# Patient Record
Sex: Male | Born: 1955 | Race: White | Hispanic: No | Smoking: Current some day smoker
Health system: Southern US, Community
[De-identification: ages and names within clinical notes are randomized; demographics above are authoritative.]

## PROBLEM LIST (undated history)

## (undated) DIAGNOSIS — F329 Major depressive disorder, single episode, unspecified: Secondary | ICD-10-CM

## (undated) DIAGNOSIS — F1910 Other psychoactive substance abuse, uncomplicated: Secondary | ICD-10-CM

## (undated) DIAGNOSIS — F32A Depression, unspecified: Secondary | ICD-10-CM

## (undated) DIAGNOSIS — F419 Anxiety disorder, unspecified: Secondary | ICD-10-CM

## (undated) DIAGNOSIS — L409 Psoriasis, unspecified: Secondary | ICD-10-CM

## (undated) HISTORY — PX: HERNIA REPAIR: SHX51

## (undated) HISTORY — DX: Other psychoactive substance abuse, uncomplicated: F19.10

## (undated) HISTORY — DX: Psoriasis, unspecified: L40.9

---

## 2001-02-10 ENCOUNTER — Ambulatory Visit (HOSPITAL_COMMUNITY): Admission: RE | Admit: 2001-02-10 | Discharge: 2001-02-10 | Payer: Self-pay | Admitting: Internal Medicine

## 2001-02-10 ENCOUNTER — Encounter: Payer: Self-pay | Admitting: Internal Medicine

## 2001-02-16 ENCOUNTER — Encounter: Payer: Self-pay | Admitting: Internal Medicine

## 2001-02-16 ENCOUNTER — Ambulatory Visit (HOSPITAL_COMMUNITY): Admission: RE | Admit: 2001-02-16 | Discharge: 2001-02-16 | Payer: Self-pay | Admitting: Internal Medicine

## 2009-09-21 ENCOUNTER — Emergency Department (HOSPITAL_COMMUNITY): Admission: EM | Admit: 2009-09-21 | Discharge: 2009-09-21 | Payer: Self-pay | Admitting: Emergency Medicine

## 2009-09-23 ENCOUNTER — Emergency Department (HOSPITAL_COMMUNITY): Admission: EM | Admit: 2009-09-23 | Discharge: 2009-09-23 | Payer: Self-pay | Admitting: Emergency Medicine

## 2010-07-07 LAB — BASIC METABOLIC PANEL
BUN: 8 mg/dL (ref 6–23)
CO2: 23 mEq/L (ref 19–32)
Calcium: 9.1 mg/dL (ref 8.4–10.5)
Chloride: 106 mEq/L (ref 96–112)
Creatinine, Ser: 0.8 mg/dL (ref 0.4–1.5)
GFR calc Af Amer: >60 mL/min (ref >60)
GFR calc non Af Amer: >60 mL/min (ref >60)
Glucose, Bld: 96 mg/dL (ref 70–99)
Potassium: 4.3 mEq/L (ref 3.5–5.1)
Sodium: 137 mEq/L (ref 135–145)

## 2010-07-07 LAB — DIFFERENTIAL
Basophils Absolute: 0.1 10*3/uL (ref 0.0–0.1)
Basophils Relative: 1 % (ref 0–1)
Eosinophils Absolute: 0.4 10*3/uL (ref 0.0–0.7)
Eosinophils Relative: 6 % — ABNORMAL HIGH (ref 0–5)
Lymphocytes Relative: 17 % (ref 12–46)
Lymphs Abs: 1.1 10*3/uL (ref 0.7–4.0)
Monocytes Absolute: 0.8 10*3/uL (ref 0.1–1.0)
Monocytes Relative: 11 % (ref 3–12)
Neutro Abs: 4.4 10*3/uL (ref 1.7–7.7)
Neutrophils Relative %: 65 % (ref 43–77)

## 2010-07-07 LAB — CBC
HCT: 49.7 % (ref 39.0–52.0)
Hemoglobin: 17 g/dL (ref 13.0–17.0)
MCHC: 34.2 g/dL (ref 30.0–36.0)
MCV: 95 fL (ref 78.0–100.0)
Platelets: 300 10*3/uL (ref 150–400)
RBC: 5.23 MIL/uL (ref 4.22–5.81)
RDW: 13.9 % (ref 11.5–15.5)
WBC: 6.7 10*3/uL (ref 4.0–10.5)

## 2011-04-01 ENCOUNTER — Encounter: Payer: Self-pay | Admitting: *Deleted

## 2011-04-01 ENCOUNTER — Encounter: Payer: Self-pay | Admitting: Cardiology

## 2011-04-02 ENCOUNTER — Ambulatory Visit: Payer: Self-pay | Admitting: Cardiology

## 2011-04-15 ENCOUNTER — Encounter: Payer: Self-pay | Admitting: Cardiology

## 2011-05-20 ENCOUNTER — Emergency Department (HOSPITAL_COMMUNITY)
Admission: EM | Admit: 2011-05-20 | Discharge: 2011-05-20 | Disposition: A | Discharge status: Home / Self Care | Payer: Self-pay | Attending: Emergency Medicine | Admitting: Emergency Medicine

## 2011-05-20 ENCOUNTER — Encounter (HOSPITAL_COMMUNITY): Payer: Self-pay | Admitting: Emergency Medicine

## 2011-05-20 ENCOUNTER — Other Ambulatory Visit: Payer: Self-pay

## 2011-05-20 ENCOUNTER — Emergency Department (HOSPITAL_COMMUNITY): Payer: Self-pay

## 2011-05-20 DIAGNOSIS — R071 Chest pain on breathing: Secondary | ICD-10-CM | POA: Insufficient documentation

## 2011-05-20 DIAGNOSIS — F17200 Nicotine dependence, unspecified, uncomplicated: Secondary | ICD-10-CM | POA: Insufficient documentation

## 2011-05-20 DIAGNOSIS — R0789 Other chest pain: Secondary | ICD-10-CM

## 2011-05-20 DIAGNOSIS — J40 Bronchitis, not specified as acute or chronic: Secondary | ICD-10-CM | POA: Insufficient documentation

## 2011-05-20 DIAGNOSIS — I10 Essential (primary) hypertension: Secondary | ICD-10-CM | POA: Insufficient documentation

## 2011-05-20 LAB — BASIC METABOLIC PANEL
CO2: 25 mEq/L (ref 19–32)
Calcium: 9.9 mg/dL (ref 8.4–10.5)
Chloride: 100 mEq/L (ref 96–112)
Sodium: 135 mEq/L (ref 135–145)

## 2011-05-20 LAB — CBC
Platelets: 308 10*3/uL (ref 150–400)
RBC: 5.01 MIL/uL (ref 4.22–5.81)
WBC: 7.7 10*3/uL (ref 4.0–10.5)

## 2011-05-20 LAB — TROPONIN I: Troponin I: <0.3 ng/mL (ref <0.30)

## 2011-05-20 MED ORDER — DOXYCYCLINE HYCLATE 100 MG PO CAPS: 100.0000 mg | ORAL_CAPSULE | Freq: Two times a day (BID) | ORAL | Status: AC

## 2011-05-20 NOTE — ED Notes (Signed)
Pt c/o intermittent cp x 6 months-worsening and sharp this am.

## 2011-05-20 NOTE — ED Provider Notes (Signed)
History   This chart was scribed for Ward Givens, MD by Clarita Crane. The patient was seen in room APA10/[facility]10 and the patient's care was started at 10:04AM.   CSN: [id number]  Arrival date & time 05/20/11  [id number]   First MD Initiated Contact with Patient 05/20/11 [phone]      Chief Complaint  Patient presents with  . Chest Pain    (Consider location/radiation/quality/duration/timing/severity/associated sxs/prior treatment) HPI Justin Washington is a 56 y.o. male who presents to the Emergency Department complaining of intermittent moderate sharp non-radiating chest pain localized to sternal region onset several months ago but more frequent the past 3 days with associated productive cough white and yellow in color. States he gets the pain about every week but has had it more frequently the past week.  States chest pain will last several seconds and then relieve. Notes chest pain is not relieved or aggravated by anything. Patient also reports that he has had his blood pressure measured several times over the past several weeks by a [facility] nurse who comes to the shelter and states it has been elevated ( 144/95 and 135/95). Denies SOB, fever, diaphoresis, fever. Patient is a current smoker and is an occasional drinker (3x per week, approximately 40oz beer). Relates he was a heavier drinker but his current shelter does breathalizer daily. Relates he started cutting back in December and he drinks about every 3 days because he feels like he is withdrawing. Denies family history of cardiac problems.   PCP-None  Past Medical History  Diagnosis Date  . Chest pain   . Hypertension     Past Surgical History  Procedure Date  . Hernia repair     Family History  Problem Relation Age of Onset  . Cancer Mother     History  Substance Use Topics  . Smoking status: Current Everyday Smoker  . Smokeless tobacco: Not on file  . 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  Allergies  Hydrocodone  Home Medications  No current outpatient prescriptions on file.     BP 156/105  Pulse 79  Temp 98.2 F (36.8 C)  Resp 18  SpO2 95%  Vital signs normal    Physical Exam  Nursing note and vitals reviewed. Constitutional: He is oriented to person, place, and time. He appears well-developed and well-nourished. No distress.  HENT:  Head: Normocephalic and atraumatic.  Mouth/Throat: Oropharynx is clear and moist. No oropharyngeal exudate.  Eyes: EOM are normal. Pupils are equal, round, and reactive to light.  Neck: Normal range of motion. Neck supple. No tracheal deviation present.  Cardiovascular: Normal rate and regular rhythm.   No murmur heard. Pulmonary/Chest: Effort normal. No respiratory distress. He has no wheezes. He has no rales. He exhibits tenderness (well localized to lower left costochondral junction and approximatel size of a dime).         The tenderness reproduces his pain.  Abdominal: Soft. He exhibits no distension. There is no tenderness.       Patient has a gap between the linea alba of his upper abdomen above the umbilicus. There is no palpable hernia at this time when examined laying down  Musculoskeletal: Normal range of motion. He exhibits no edema.  Neurological: He is alert and oriented to person, place,  and time. No sensory deficit.  Skin: Skin is warm and dry.  Psychiatric: He has a normal mood and affect. His speech is normal and behavior is normal.    ED Course  Procedures (including critical care time)  DIAGNOSTIC STUDIES: Oxygen Saturation is 95% on room air, adequate by my interpretation.    COORDINATION OF CARE: 10:13AM- Patient informed of lab and imaging results and intent to d/c home. Patient agrees with plan set forth at this time.     Results for orders placed during the  hospital encounter of 05/20/11  CBC      Component Value Range   WBC 7.7  4.0 - 10.5 (K/uL)   RBC 5.01  4.22 - 5.81 (MIL/uL)   Hemoglobin 16.0  13.0 - 17.0 (g/dL)   HCT 40.9  81.1 - 91.4 (%)   MCV 92.8  78.0 - 100.0 (fL)   MCH 31.9  26.0 - 34.0 (pg)   MCHC 34.4  30.0 - 36.0 (g/dL)   RDW 78.2  95.6 - 21.3 (%)   Platelets 308  150 - 400 (K/uL)  BASIC METABOLIC PANEL      Component Value Range   Sodium 135  135 - 145 (mEq/L)   Potassium 4.1  3.5 - 5.1 (mEq/L)   Chloride 100  96 - 112 (mEq/L)   CO2 25  19 - 32 (mEq/L)   Glucose, Bld 98  70 - 99 (mg/dL)   BUN 13  6 - 23 (mg/dL)   Creatinine, Ser 0.86  0.50 - 1.35 (mg/dL)   Calcium 9.9  8.4 - 57.8 (mg/dL)   GFR calc non Af Amer >90  >90 (mL/min)   GFR calc Af Amer >90  >90 (mL/min)  TROPONIN I      Component Value Range   Troponin I <0.30  <0.30 (ng/mL)   Laboratory interpretation all normal except    Chest Portable 1 View  05/20/2011  *RADIOLOGY REPORT*  Clinical Data: Chest pain, smoker.  PORTABLE CHEST - 1 VIEW  Comparison: None  Findings: Heart and mediastinal contours are within normal limits. No focal opacities or effusions.  No acute bony abnormality.  IMPRESSION: No active cardiopulmonary disease.  Original Report Authenticated By: Cyndie Chime, M.D.    Date: 05/20/2011  Rate: 80  Rhythm: normal sinus rhythm  QRS Axis: normal  Intervals: normal  ST/T Wave abnormalities: normal  Conduction Disutrbances:none  Narrative Interpretation:   Old EKG Reviewed: none available   Diagnoses that have been ruled out:  None  Diagnoses that are still under consideration:  None  Final diagnoses:  Chest wall pain  Bronchitis   New Prescriptions   DOXYCYCLINE (VIBRAMYCIN) 100 MG CAPSULE    Take 1 capsule (100 mg total) by mouth 2 (two) times daily.    Plan discharge   Devoria Albe, MD, FACEP    MDM     I personally performed the services described in this documentation, which was scribed in my presence. The recorded  information has been reviewed and considered. Devoria Albe, MD, Armando Gang      Ward Givens, MD 05/20/11 [phone]

## 2011-05-29 ENCOUNTER — Encounter: Payer: Self-pay | Admitting: Cardiology

## 2011-06-01 NOTE — Progress Notes (Signed)
This encounter was created in error - please disregard.

## 2011-06-30 ENCOUNTER — Encounter: Payer: Self-pay | Admitting: Cardiology

## 2011-10-28 IMAGING — CR DG FOOT COMPLETE 3+V*L*
3 series · 3 of 3 positions shown · non-contrast
Comparison: None.

CLINICAL DATA: Erythema and pain with swelling over the dorsum of
the left foot in the region of the second and third toes

LEFT FOOT - COMPLETE 3+ VIEW

[view not recorded (1 of 3)]
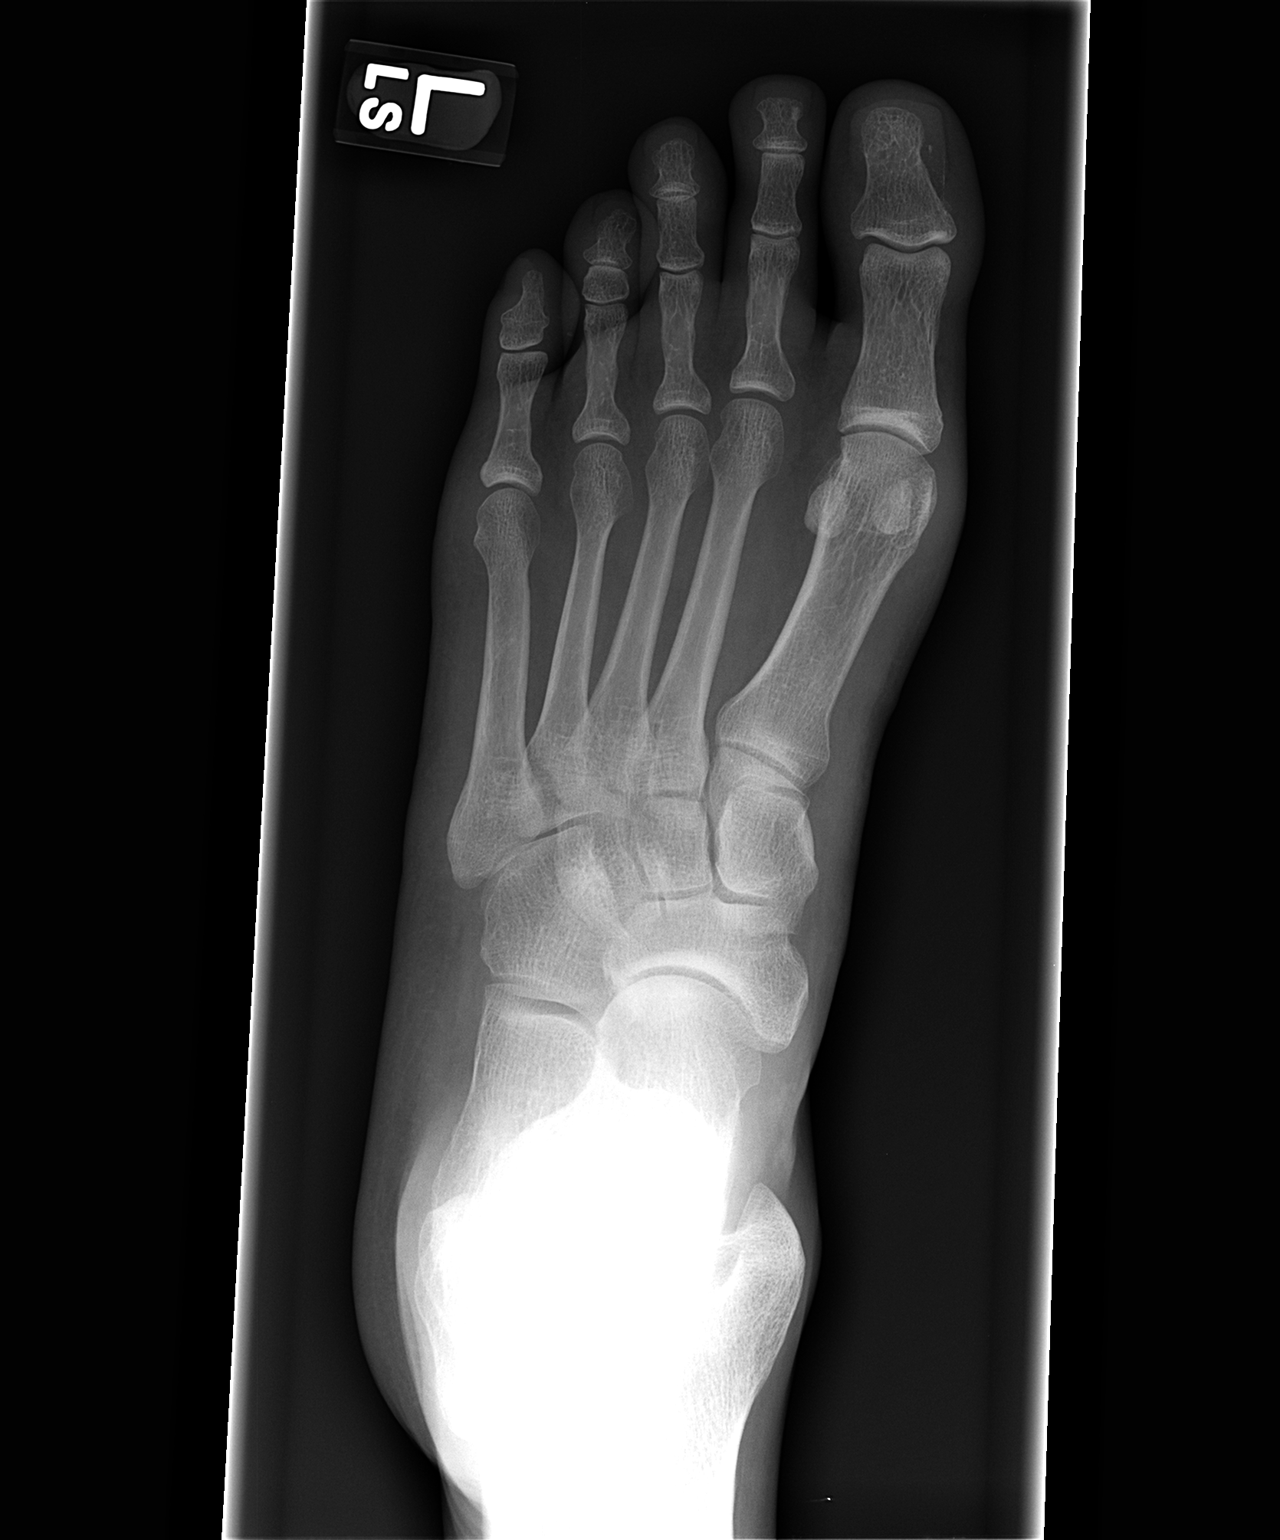

[view not recorded (2 of 3)]
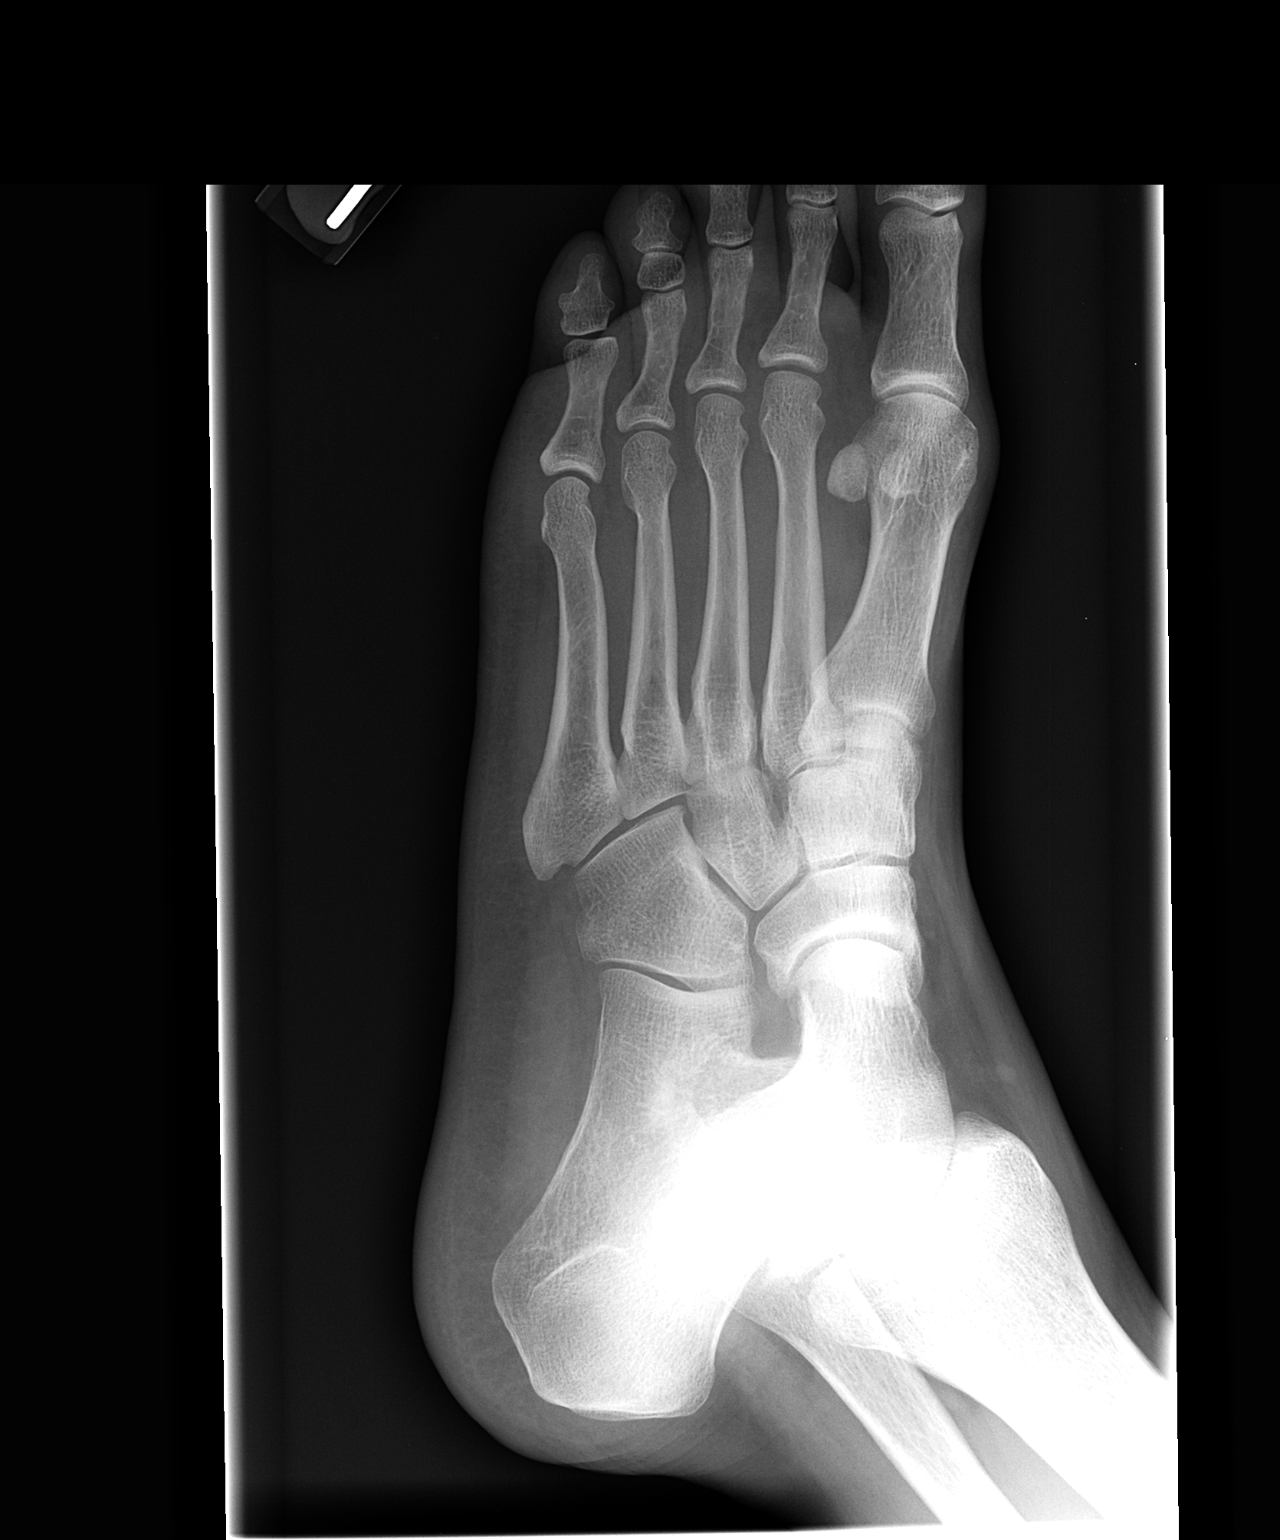

[view not recorded (3 of 3)]
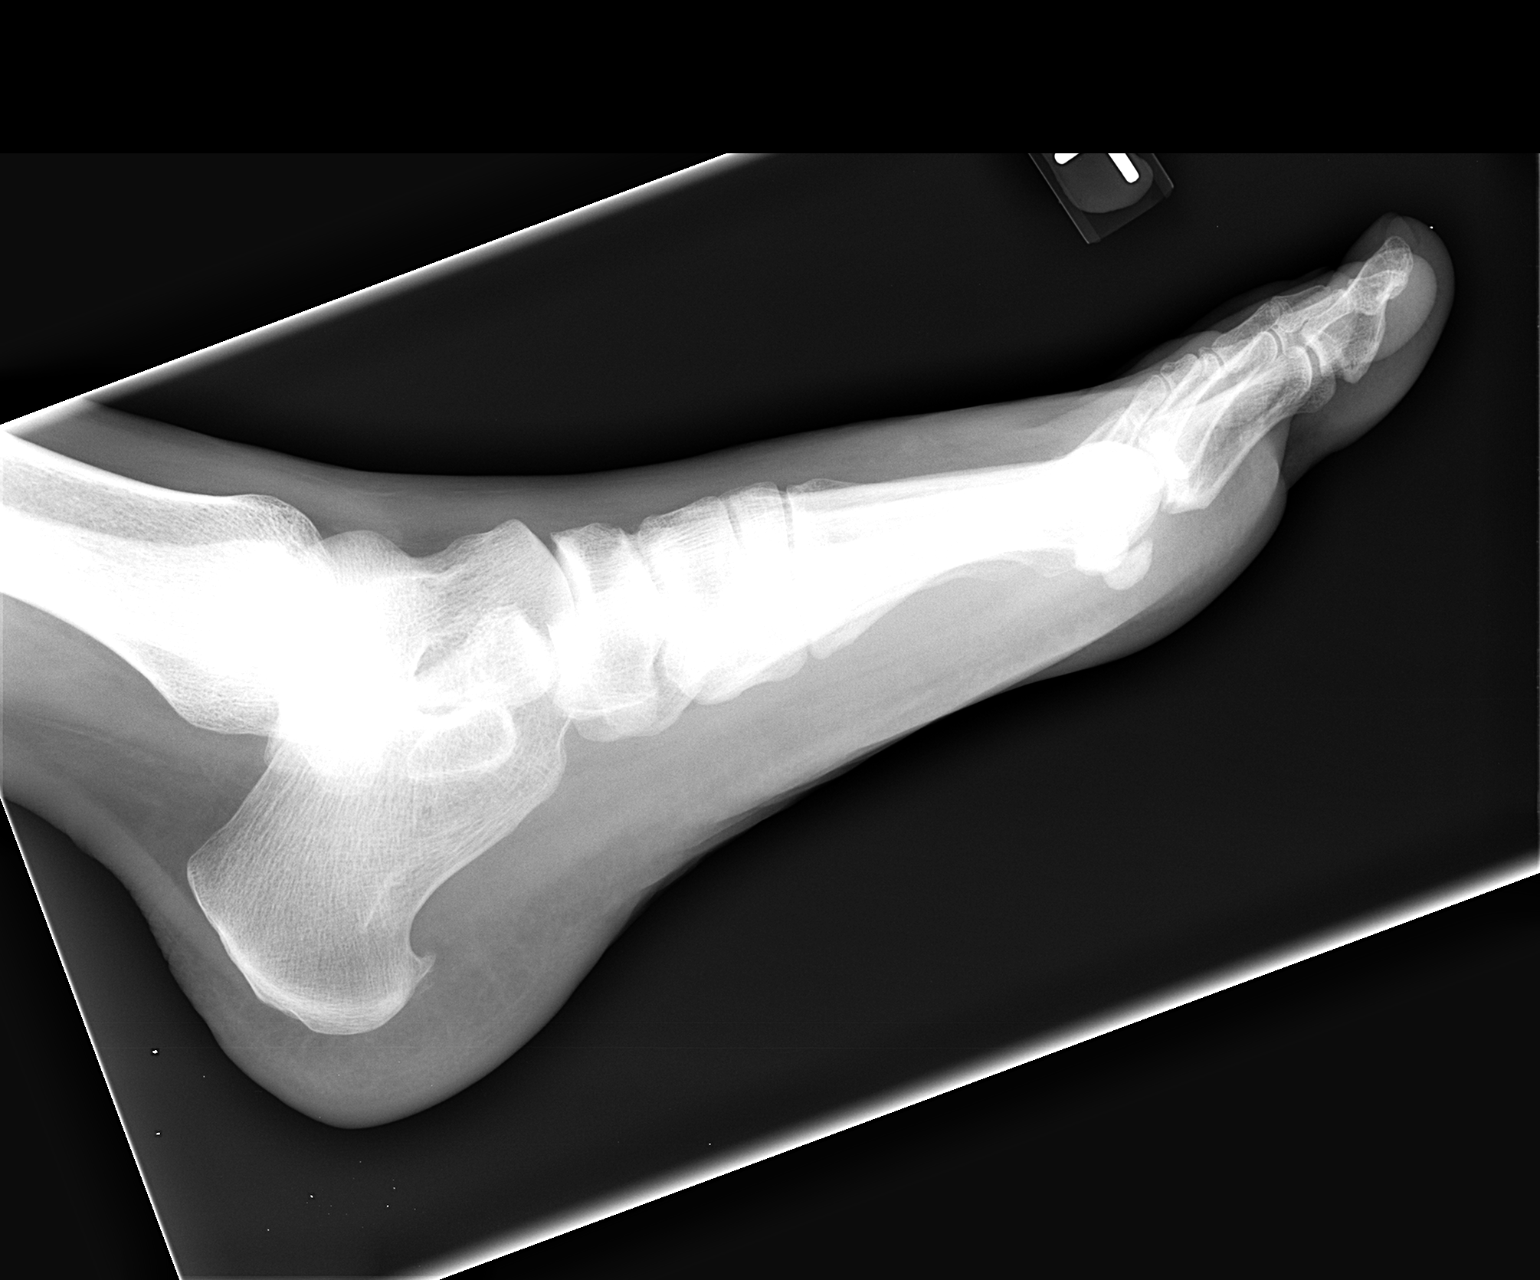

[3 of 3 positions shown; findings below may reference images not displayed]

FINDINGS: No fracture or dislocation.  No soft tissue abnormality.
No radiopaque foreign body. Plantar calcaneal spurring noted.
IMPRESSION: No acute bony abnormality.

## 2013-05-08 ENCOUNTER — Other Ambulatory Visit: Payer: Self-pay | Admitting: Nurse Practitioner

## 2013-09-06 ENCOUNTER — Other Ambulatory Visit: Payer: Self-pay | Admitting: Nurse Practitioner

## 2016-02-04 ENCOUNTER — Ambulatory Visit: Payer: Self-pay | Admitting: Physician Assistant

## 2016-02-04 ENCOUNTER — Other Ambulatory Visit: Payer: Self-pay | Admitting: Physician Assistant

## 2016-02-04 ENCOUNTER — Encounter: Payer: Self-pay | Admitting: Physician Assistant

## 2016-02-04 VITALS — BP 130/84 | HR 80 | Temp 98.1°F | Ht 67.5 in | Wt 187.5 lb

## 2016-02-04 DIAGNOSIS — Z131 Encounter for screening for diabetes mellitus: Secondary | ICD-10-CM

## 2016-02-04 DIAGNOSIS — Z125 Encounter for screening for malignant neoplasm of prostate: Secondary | ICD-10-CM

## 2016-02-04 DIAGNOSIS — Z8639 Personal history of other endocrine, nutritional and metabolic disease: Secondary | ICD-10-CM

## 2016-02-04 DIAGNOSIS — Z1211 Encounter for screening for malignant neoplasm of colon: Secondary | ICD-10-CM

## 2016-02-04 DIAGNOSIS — L409 Psoriasis, unspecified: Secondary | ICD-10-CM

## 2016-02-04 LAB — GLUCOSE, POCT (MANUAL RESULT ENTRY): POC GLUCOSE: 87 mg/dL (ref 70–99)

## 2016-02-04 MED ORDER — TRIAMCINOLONE ACETONIDE 0.1 % EX OINT: 1.0000 "application " | TOPICAL_OINTMENT | Freq: Two times a day (BID) | CUTANEOUS | 0 refills | Status: DC | PRN

## 2016-02-04 NOTE — Progress Notes (Signed)
BP 130/84 (BP Location: Left Arm, Patient Position: Sitting, Cuff Size: Normal)   Pulse 80   Temp 98.1 F (36.7 C)   Ht 5' 7.5" (1.715 m)   Wt 187 lb 8 oz (85 kg)   SpO2 96%   BMI 28.93 kg/m    Subjective:    Patient ID: Justin Washington, male    DOB: Oct 03, 1955, 60 y.o.   MRN: [id number]  HPI: Justin Washington is a 60 y.o. male presenting on 02/04/2016 for New Patient (Initial Visit) (pt states he previously came to this practice about 5 years ago) and Hypertension (pt states BP yesterday when he went to the dentist was between 150/100)   HPI  Chief Complaint  Patient presents with  . New Patient (Initial Visit)    pt states he previously came to this practice about 5 years ago  . Hypertension    pt states BP yesterday when he went to the dentist was between 150/100    Pt was seen here previously.  Last OV 10/24/12.  Pt comes in now because he wants some dental work done and the dentist says his blood pressure has been elevated.  Pt has no history of hypertension.  Pt has remote history substance abuse but is staying sober and is doing well.  Relevant past medical, surgical, family and social history reviewed and updated as indicated. Interim medical history since our last visit reviewed. Allergies and medications reviewed and updated.  CURRENT MEDS: none  Review of Systems  Constitutional: Negative for appetite change, chills, diaphoresis, fatigue, fever and unexpected weight change.  HENT: Negative for congestion, dental problem, drooling, ear pain, facial swelling, hearing loss, mouth sores, sneezing, sore throat, trouble swallowing and voice change.   Eyes: Negative for pain, discharge, redness, itching and visual disturbance.  Respiratory: Negative for cough, choking, shortness of breath and wheezing.   Cardiovascular: Negative for chest pain, palpitations and leg swelling.  Gastrointestinal: Negative for abdominal pain, blood in stool, constipation, diarrhea and  vomiting.  Endocrine: Negative for cold intolerance, heat intolerance and polydipsia.  Genitourinary: Negative for decreased urine volume, dysuria and hematuria.  Musculoskeletal: Negative for arthralgias, back pain and gait problem.  Skin: Negative for rash.  Allergic/Immunologic: Negative for environmental allergies.  Neurological: Negative for seizures, syncope, light-headedness and headaches.  Hematological: Negative for adenopathy.  Psychiatric/Behavioral: Negative for agitation, dysphoric mood and suicidal ideas. The patient is not nervous/anxious.     Per HPI unless specifically indicated above     Objective:    BP 130/84 (BP Location: Left Arm, Patient Position: Sitting, Cuff Size: Normal)   Pulse 80   Temp 98.1 F (36.7 C)   Ht 5' 7.5" (1.715 m)   Wt 187 lb 8 oz (85 kg)   SpO2 96%   BMI 28.93 kg/m   Wt Readings from Last 3 Encounters:  02/04/16 187 lb 8 oz (85 kg)    Physical Exam  Constitutional: He is oriented to person, place, and time. He appears well-developed and well-nourished.  HENT:  Head: Normocephalic and atraumatic.  Mouth/Throat: Oropharynx is clear and moist. No oropharyngeal exudate.  Eyes: Conjunctivae and EOM are normal. Pupils are equal, round, and reactive to light.  Neck: Neck supple. No thyromegaly present.  Cardiovascular: Normal rate and regular rhythm.   Pulmonary/Chest: Effort normal and breath sounds normal. He has no wheezes. He has no rales.  Abdominal: Soft. Bowel sounds are normal. He exhibits no mass. There is no hepatosplenomegaly. There is no tenderness.  Musculoskeletal: He exhibits no edema.  Lymphadenopathy:    He has no cervical adenopathy.  Neurological: He is alert and oriented to person, place, and time.  Skin: Skin is warm and dry. No rash noted.  Plaques bilateral elbows  Psychiatric: He has a normal mood and affect. His behavior is normal. Thought content normal.  Vitals reviewed.     Results for orders placed or  performed in visit on 02/04/16  POCT Glucose (CBG)  Result Value Ref Range   POC Glucose 87 70 - 99 mg/dl      Assessment & Plan:   Encounter Diagnoses  Name Primary?  . Psoriasis Yes  . History of hyperlipidemia   . Screening for prostate cancer   . Screening for diabetes mellitus (DM)   . Special screening for malignant neoplasms, colon     -ifobt was given for colon cancer screening -blood pressure good today.   -will fill out form and fax to dentist -rx TAC ointment given for elbows -f/u one month to review labs and recheck bp. RTO sooner prn

## 2016-02-05 LAB — COMPREHENSIVE METABOLIC PANEL
ALBUMIN: 4.3 g/dL (ref 3.6–5.1)
ALT: 16 U/L (ref 9–46)
AST: 16 U/L (ref 10–35)
Alkaline Phosphatase: 43 U/L (ref 40–115)
BUN: 18 mg/dL (ref 7–25)
CALCIUM: 10.1 mg/dL (ref 8.6–10.3)
CO2: 25 mmol/L (ref 20–31)
Chloride: 104 mmol/L (ref 98–110)
Creat: 1 mg/dL (ref 0.70–1.25)
GLUCOSE: 93 mg/dL (ref 65–99)
POTASSIUM: 5.5 mmol/L — AB (ref 3.5–5.3)
Sodium: 140 mmol/L (ref 135–146)
Total Bilirubin: 0.5 mg/dL (ref 0.2–1.2)
Total Protein: 7 g/dL (ref 6.1–8.1)

## 2016-02-05 LAB — LIPID PANEL
CHOL/HDL RATIO: 4.2 ratio (ref <=5.0)
CHOLESTEROL: 194 mg/dL (ref 125–200)
HDL: 46 mg/dL (ref >=40)
LDL Cholesterol: 131 mg/dL — ABNORMAL HIGH (ref <130)
TRIGLYCERIDES: 83 mg/dL (ref <150)
VLDL: 17 mg/dL (ref <30)

## 2016-02-06 LAB — IFOBT (OCCULT BLOOD): IMMUNOLOGICAL FECAL OCCULT BLOOD TEST: NEGATIVE

## 2016-02-06 LAB — PSA: PSA: 1.5 ng/mL (ref <=4.0)

## 2016-03-04 ENCOUNTER — Encounter: Payer: Self-pay | Admitting: Physician Assistant

## 2016-03-04 ENCOUNTER — Ambulatory Visit: Payer: Self-pay | Admitting: Physician Assistant

## 2016-03-04 VITALS — BP 124/72 | HR 80 | Temp 97.9°F | Ht 67.5 in | Wt 189.5 lb

## 2016-03-04 DIAGNOSIS — E785 Hyperlipidemia, unspecified: Secondary | ICD-10-CM

## 2016-03-04 DIAGNOSIS — L409 Psoriasis, unspecified: Secondary | ICD-10-CM

## 2016-03-04 DIAGNOSIS — E875 Hyperkalemia: Secondary | ICD-10-CM

## 2016-03-04 NOTE — Progress Notes (Signed)
BP 124/72 (BP Location: Left Arm, Patient Position: Sitting, Cuff Size: Normal)   Pulse 80   Temp 97.9 F (36.6 C)   Ht 5' 7.5" (1.715 m)   Wt 189 lb 8 oz (86 kg)   SpO2 93%   BMI 29.24 kg/m    Subjective:    Patient ID: Justin Washington, male    DOB: Jan 15, 1956, 60 y.o.   MRN: [id number]  HPI: Justin Washington is a 60 y.o. male presenting on 03/04/2016 for Follow-up and Eczema   HPI   Pt has been using the TAC ointment on his psoriasis areas but says he is not getting any resolution to it.  Says it doesn't seem to do much more than regular lotion.  Relevant past medical, surgical, family and social history reviewed and updated as indicated. Interim medical history since our last visit reviewed. Allergies and medications reviewed and updated.   Current Outpatient Prescriptions:  .  triamcinolone ointment (KENALOG) 0.1 %, Apply 1 application topically 2 (two) times daily as needed., Disp: 80 g, Rfl: 0   Review of Systems  Constitutional: Negative for appetite change, chills, diaphoresis, fatigue, fever and unexpected weight change.  HENT: Negative for congestion, dental problem, drooling, ear pain, facial swelling, hearing loss, mouth sores, sneezing, sore throat, trouble swallowing and voice change.   Eyes: Negative for pain, discharge, redness, itching and visual disturbance.  Respiratory: Negative for cough, choking, shortness of breath and wheezing.   Cardiovascular: Negative for chest pain, palpitations and leg swelling.  Gastrointestinal: Negative for abdominal pain, blood in stool, constipation, diarrhea and vomiting.  Endocrine: Negative for cold intolerance, heat intolerance and polydipsia.  Genitourinary: Negative for decreased urine volume, dysuria and hematuria.  Musculoskeletal: Negative for arthralgias, back pain and gait problem.  Skin: Negative for rash.  Allergic/Immunologic: Negative for environmental allergies.  Neurological: Negative for seizures, syncope,  light-headedness and headaches.  Hematological: Negative for adenopathy.  Psychiatric/Behavioral: Negative for agitation, dysphoric mood and suicidal ideas. The patient is not nervous/anxious.     Per HPI unless specifically indicated above     Objective:    BP 124/72 (BP Location: Left Arm, Patient Position: Sitting, Cuff Size: Normal)   Pulse 80   Temp 97.9 F (36.6 C)   Ht 5' 7.5" (1.715 m)   Wt 189 lb 8 oz (86 kg)   SpO2 93%   BMI 29.24 kg/m   Wt Readings from Last 3 Encounters:  03/04/16 189 lb 8 oz (86 kg)  02/04/16 187 lb 8 oz (85 kg)    Physical Exam  Constitutional: He is oriented to person, place, and time. He appears well-developed and well-nourished.  HENT:  Head: Normocephalic and atraumatic.  Neck: Neck supple.  Cardiovascular: Normal rate and regular rhythm.   Pulmonary/Chest: Effort normal and breath sounds normal. He has no wheezes.  Abdominal: Soft. Bowel sounds are normal. There is no hepatosplenomegaly. There is no tenderness.  Musculoskeletal: He exhibits no edema.  Lymphadenopathy:    He has no cervical adenopathy.  Neurological: He is alert and oriented to person, place, and time.  Skin: Skin is warm and dry. Rash noted.  Scattered plaques- on B elbows and BLE.  Psychiatric: He has a normal mood and affect. His behavior is normal.  Vitals reviewed.   Plaques scattered.    Results for orders placed or performed in visit on 02/04/16  IFOBT POC (occult bld, rslt in office)  Result Value Ref Range   IFOBT Negative  Assessment & Plan:   Encounter Diagnoses  Name Primary?  . Hyperkalemia Yes  . Hyperlipidemia, unspecified hyperlipidemia type   . Psoriasis     -Reviewed las with pt -Recheck K+ -recommended Lowfat diet and exercise . Recheck 3 months -Refer to dermatology for psoriaisis -follow up 3 months.  RTO sooner prn

## 2016-03-04 NOTE — Patient Instructions (Signed)
Fat and Cholesterol Restricted Diet High levels of fat and cholesterol in your blood may lead to various health problems, such as diseases of the heart, blood vessels, gallbladder, liver, and pancreas. Fats are concentrated sources of energy that come in various forms. Certain types of fat, including saturated fat, may be harmful in excess. Cholesterol is a substance needed by your body in small amounts. Your body makes all the cholesterol it needs. Excess cholesterol comes from the food you eat. When you have high levels of cholesterol and saturated fat in your blood, health problems can develop because the excess fat and cholesterol will gather along the walls of your blood vessels, causing them to narrow. Choosing the right foods will help you control your intake of fat and cholesterol. This will help keep the levels of these substances in your blood within normal limits and reduce your risk of disease. What is my plan? Your health care provider recommends that you:  Limit your fat intake to ______% or less of your total calories per day.  Limit the amount of cholesterol in your diet to less than _________mg per day.  Eat 20-30 grams of fiber each day.  What types of fat should I choose?  Choose healthy fats more often. Choose monounsaturated and polyunsaturated fats, such as olive and canola oil, flaxseeds, walnuts, almonds, and seeds.  Eat more omega-3 fats. Good choices include salmon, mackerel, sardines, tuna, flaxseed oil, and ground flaxseeds. Aim to eat fish at least two times a week.  Limit saturated fats. Saturated fats are primarily found in animal products, such as meats, butter, and cream. Plant sources of saturated fats include palm oil, palm kernel oil, and coconut oil.  Avoid foods with partially hydrogenated oils in them. These contain trans fats. Examples of foods that contain trans fats are stick margarine, some tub margarines, cookies, crackers, and other baked goods. What  general guidelines do I need to follow? These guidelines for healthy eating will help you control your intake of fat and cholesterol:  Check food labels carefully to identify foods with trans fats or high amounts of saturated fat.  Fill one half of your plate with vegetables and green salads.  Fill one fourth of your plate with whole grains. Look for the word "whole" as the first word in the ingredient list.  Fill one fourth of your plate with lean protein foods.  Limit fruit to two servings a day. Choose fruit instead of juice.  Eat more foods that contain fiber, such as apples, broccoli, carrots, beans, peas, and barley.  Eat more home-cooked food and less restaurant, buffet, and fast food.  Limit or avoid alcohol.  Limit foods high in starch and sugar.  Limit fried foods.  Cook foods using methods other than frying. Baking, boiling, grilling, and broiling are all great options.  Lose weight if you are overweight. Losing just 5-10% of your initial body weight can help your overall health and prevent diseases such as diabetes and heart disease.  What foods can I eat? Grains  Whole grains, such as whole wheat or whole grain breads, crackers, cereals, and pasta. Unsweetened oatmeal, bulgur, barley, quinoa, or brown rice. Corn or whole wheat flour tortillas. Vegetables  Fresh or frozen vegetables (raw, steamed, roasted, or grilled). Green salads. Fruits  All fresh, canned (in natural juice), or frozen fruits. Meats and other protein foods  Ground beef (85% or leaner), grass-fed beef, or beef trimmed of fat. Skinless chicken or turkey. Ground chicken or turkey.   Pork trimmed of fat. All fish and seafood. Eggs. Dried beans, peas, or lentils. Unsalted nuts or seeds. Unsalted canned or dry beans. Dairy  Low-fat dairy products, such as skim or 1% milk, 2% or reduced-fat cheeses, low-fat ricotta or cottage cheese, or plain low-fat yo Fats and oils  Tub margarines without trans  fats. Light or reduced-fat mayonnaise and salad dressings. Avocado. Olive, canola, sesame, or safflower oils. Natural peanut or almond butter (choose ones without added sugar and oil). The items listed above may not be a complete list of recommended foods or beverages. Contact your dietitian for more options. Foods to avoid Grains  White bread. White pasta. White rice. Cornbread. Bagels, pastries, and croissants. Crackers that contain trans fat. Vegetables  White potatoes. Corn. Creamed or fried vegetables. Vegetables in a cheese sauce. Fruits  Dried fruits. Canned fruit in light or heavy syrup. Fruit juice. Meats and other protein foods  Fatty cuts of meat. Ribs, chicken wings, bacon, sausage, bologna, salami, chitterlings, fatback, hot dogs, bratwurst, and packaged luncheon meats. Liver and organ meats. Dairy  Whole or 2% milk, cream, half-and-half, and cream cheese. Whole milk cheeses. Whole-fat or sweetened yogurt. Full-fat cheeses. Nondairy creamers and whipped toppings. Processed cheese, cheese spreads, or cheese curds. Beverages  Alcohol. Sweetened drinks (such as sodas, lemonade, and fruit drinks or punches). Fats and oils  Butter, stick margarine, lard, shortening, ghee, or bacon fat. Coconut, palm kernel, or palm oils. Sweets and desserts  Corn syrup, sugars, honey, and molasses. Candy. Jam and jelly. Syrup. Sweetened cereals. Cookies, pies, cakes, donuts, muffins, and ice cream. The items listed above may not be a complete list of foods and beverages to avoid. Contact your dietitian for more information. This information is not intended to replace advice given to you by your health care provider. Make sure you discuss any questions you have with your health care provider. Document Released: 04/06/2005 Document Revised: 04/27/2014 Document Reviewed: 07/05/2013 [employer] Interactive Patient Education  2017 [employer]  

## 2016-03-05 LAB — POTASSIUM: POTASSIUM: 4.4 mmol/L (ref 3.5–5.3)

## 2016-05-26 ENCOUNTER — Other Ambulatory Visit: Payer: Self-pay

## 2016-05-26 DIAGNOSIS — E785 Hyperlipidemia, unspecified: Secondary | ICD-10-CM

## 2016-05-29 LAB — LIPID PANEL
CHOLESTEROL: 188 mg/dL (ref <200)
HDL: 47 mg/dL (ref >40)
LDL Cholesterol: 126 mg/dL — ABNORMAL HIGH (ref <100)
TRIGLYCERIDES: 76 mg/dL (ref <150)
Total CHOL/HDL Ratio: 4 Ratio (ref <5.0)
VLDL: 15 mg/dL (ref <30)

## 2016-06-03 ENCOUNTER — Ambulatory Visit: Payer: Self-pay | Admitting: Physician Assistant

## 2016-06-03 ENCOUNTER — Encounter: Payer: Self-pay | Admitting: Physician Assistant

## 2016-06-03 VITALS — BP 126/84 | HR 89 | Temp 97.9°F | Ht 67.5 in | Wt 196.5 lb

## 2016-06-03 DIAGNOSIS — L409 Psoriasis, unspecified: Secondary | ICD-10-CM

## 2016-06-03 DIAGNOSIS — Z125 Encounter for screening for malignant neoplasm of prostate: Secondary | ICD-10-CM

## 2016-06-03 DIAGNOSIS — E785 Hyperlipidemia, unspecified: Secondary | ICD-10-CM

## 2016-06-03 MED ORDER — MOMETASONE FUROATE 0.1 % EX CREA: 1.0000 "application " | TOPICAL_CREAM | Freq: Every day | CUTANEOUS | 0 refills | Status: DC

## 2016-06-03 NOTE — Progress Notes (Signed)
BP 126/84 (BP Location: Left Arm, Patient Position: Sitting, Cuff Size: Normal)   Pulse 89   Temp 97.9 F (36.6 C) (Other (Comment))   Ht 5' 7.5" (1.715 m)   Wt 196 lb 8 oz (89.1 kg)   SpO2 96%   BMI 30.32 kg/m    Subjective:    Patient ID: Justin Washington, male    DOB: March 12, 1956, 61 y.o.   MRN: [id number]  HPI: Justin Washington is a 61 y.o. male presenting on 06/03/2016 for Hyperlipidemia   HPI   Pt went to derm.  He says he got some product that he says didn't help much.  He says they mentioned injectibles, but he says he just isn't sure because he needs more information.  Pt specifically requests rx for elocon because he said that helped him one time in the past.   Relevant past medical, surgical, family and social history reviewed and updated as indicated. Interim medical history since our last visit reviewed. Allergies and medications reviewed and updated.  Review of Systems  Constitutional: Negative for appetite change, chills, diaphoresis, fatigue, fever and unexpected weight change.  HENT: Negative for congestion, dental problem, drooling, ear pain, facial swelling, hearing loss, mouth sores, sneezing, sore throat, trouble swallowing and voice change.   Eyes: Negative for pain, discharge, redness, itching and visual disturbance.  Respiratory: Negative for cough, choking, shortness of breath and wheezing.   Cardiovascular: Negative for chest pain, palpitations and leg swelling.  Gastrointestinal: Negative for abdominal pain, blood in stool, constipation, diarrhea and vomiting.  Endocrine: Negative for cold intolerance, heat intolerance and polydipsia.  Genitourinary: Negative for decreased urine volume, dysuria and hematuria.  Musculoskeletal: Negative for arthralgias, back pain and gait problem.  Skin: Negative for rash.  Allergic/Immunologic: Negative for environmental allergies.  Neurological: Negative for seizures, syncope, light-headedness and headaches.   Hematological: Negative for adenopathy.  Psychiatric/Behavioral: Negative for agitation, dysphoric mood and suicidal ideas. The patient is not nervous/anxious.     Per HPI unless specifically indicated above     Objective:    BP 126/84 (BP Location: Left Arm, Patient Position: Sitting, Cuff Size: Normal)   Pulse 89   Temp 97.9 F (36.6 C) (Other (Comment))   Ht 5' 7.5" (1.715 m)   Wt 196 lb 8 oz (89.1 kg)   SpO2 96%   BMI 30.32 kg/m   Wt Readings from Last 3 Encounters:  06/03/16 196 lb 8 oz (89.1 kg)  03/04/16 189 lb 8 oz (86 kg)  02/04/16 187 lb 8 oz (85 kg)    Physical Exam  Constitutional: He is oriented to person, place, and time. He appears well-developed and well-nourished.  HENT:  Head: Normocephalic and atraumatic.  Neck: Neck supple.  Cardiovascular: Normal rate and regular rhythm.   Pulmonary/Chest: Effort normal and breath sounds normal. He has no wheezes.  Abdominal: Soft. Bowel sounds are normal. There is no hepatosplenomegaly. There is no tenderness.  Musculoskeletal: He exhibits no edema.  Lymphadenopathy:    He has no cervical adenopathy.  Neurological: He is alert and oriented to person, place, and time.  Skin: Skin is warm and dry. Rash noted.  Plaques scattered- mostly on extremities  Psychiatric: He has a normal mood and affect. His behavior is normal.  Vitals reviewed.   Results for orders placed or performed in visit on 05/26/16  Lipid Profile  Result Value Ref Range   Cholesterol 188 <200 mg/dL   Triglycerides 76 <409<150 mg/dL   HDL 47 >81>40 mg/dL  Total CHOL/HDL Ratio 4.0 <5.0 Ratio   VLDL 15 <30 mg/dL   LDL Cholesterol 213 (H) <100 mg/dL      Assessment & Plan:   Encounter Diagnoses  Name Primary?  . Psoriasis Yes  . Hyperlipidemia, unspecified hyperlipidemia type   . Screening for prostate cancer     -reviewed labs with pt.  Discussed that since he has no co-morbids, he can manage his lipids with lowfat diet and exercise.   -Discussed psoriasis and treatments at length.  Discussed risks and benefits of injectibles like humira.  Gave pt handouts and reading information containing risks and benefits of the medication.  rx for elocon given -follow up in October.  Pt told to RTO sooner if he wants to return to derm for his psoriasis  (The duration of this appointment visit was 25 minutes of face-to-face time with the patient.  Greater than 50% of this time was spent in counseling, explanation of diagnosis, planning of further management, and coordination of care.)

## 2016-06-03 NOTE — Patient Instructions (Signed)
Psoriasis Introduction Psoriasis is a long-term (chronic) condition of skin inflammation. It occurs because your immune system causes skin cells to form too quickly. As a result, too many skin cells grow and create raised, red patches (plaques) that look silvery on your skin. Plaques may appear anywhere on your body. They can be any size or shape. Psoriasis can come and go. The condition varies from mild to very severe. It cannot be passed from one person to another (not contagious). What are the causes? The cause of psoriasis is not known, but certain factors can make the condition worse. These include:  Damage or trauma to the skin, such as cuts, scrapes, sunburn, and dryness.  Lack of sunlight.  Certain medicines.  Alcohol.  Tobacco use.  Stress.  Infections caused by bacteria or viruses. What increases the risk? This condition is more likely to develop in:  People with a family history of psoriasis.  People who are Caucasian.  People who are between the ages of 15-30 and 50-60 years old. What are the signs or symptoms? There are five different types of psoriasis. You can have more than one type of psoriasis during your life. Types are:  Plaque.  Guttate.  Inverse.  Pustular.  Erythrodermic. Each type of psoriasis has different symptoms.  Plaque psoriasis symptoms include red, raised plaques with a silvery white coating (scale). These plaques may be itchy. Your nails may be pitted and crumbly or fall off.  Guttate psoriasis symptoms include small red spots that often show up on your trunk, arms, and legs. These spots may develop after you have been sick, especially with strep throat.  Inverse psoriasis symptoms include plaques in your underarm area, under your breasts, or on your genitals, groin, or buttocks.  Pustular psoriasis symptoms include pus-filled bumps that are painful, red, and swollen on the palms of your hands or the soles of your feet. You also may  feel exhausted, feverish, weak, or have no appetite.  Erythrodermic psoriasis symptoms include bright red skin that may look burned. You may have a fast heartbeat and a body temperature that is too high or too low. You may be itchy or in pain. How is this diagnosed? Your health care provider may suspect psoriasis based on your symptoms and family history. Your health care provider will also do a physical exam. This may include a procedure to remove a tissue sample (biopsy) for testing. You may also be referred to a health care provider who specializes in skin diseases (dermatologist). How is this treated? There is no cure for this condition, but treatment can help manage it. Goals of treatment include:  Helping your skin heal.  Reducing itching and inflammation.  Slowing the growth of new skin cells.  Helping your immune system respond better to your skin. Treatment varies, depending on the severity of your condition. Treatment may include:  Creams or ointments.  Ultraviolet ray exposure (light therapy). This may include natural sunlight or light therapy in a medical office.  Medicines (systemic therapy). These medicines can help your body better manage skin cell turnover and inflammation. They may be used along with light therapy or ointments. You may also get antibiotic medicines if you have an infection. Follow these instructions at home: Skin Care  Moisturize your skin as needed. Only use moisturizers that have been approved by your health care provider.  Apply cool compresses to the affected areas.  Do not scratch your skin. Lifestyle  Do not use tobacco products. This includes cigarettes,   chewing tobacco, and e-cigarettes. If you need help quitting, ask your health care provider.  Drink little or no alcohol.  Try techniques for stress reduction, such as meditation or yoga.  Get exposure to the sun as told by your health care provider. Do not get sunburned.  Consider  joining a psoriasis support group. Medicines  Take or use over-the-counter and prescription medicines only as told by your health care provider.  If you were prescribed an antibiotic, take or use it as told by your health care provider. Do not stop taking the antibiotic even if your condition starts to improve. General instructions  Keep a journal to help track what triggers an outbreak. Try to avoid any triggers.  See a counselor or social worker if feelings of sadness, frustration, and hopelessness about your condition are interfering with your work and relationships.  Keep all follow-up visits as told by your health care provider. This is important. Contact a health care provider if:  Your pain gets worse.  You have increasing redness or warmth in the affected areas.  You have new or worsening pain or stiffness in your joints.  Your nails start to break easily or pull away from the nail bed.  You have a fever.  You feel depressed. This information is not intended to replace advice given to you by your health care provider. Make sure you discuss any questions you have with your health care provider. Document Released: 04/03/2000 Document Revised: 09/12/2015 Document Reviewed: 08/22/2014  2017 [employer]  

## 2016-07-14 ENCOUNTER — Other Ambulatory Visit: Payer: Self-pay | Admitting: Physician Assistant

## 2016-07-14 MED ORDER — TRIAMCINOLONE ACETONIDE 0.1 % EX OINT: 1.0000 "application " | TOPICAL_OINTMENT | Freq: Two times a day (BID) | CUTANEOUS | 0 refills | Status: DC | PRN

## 2016-07-28 ENCOUNTER — Other Ambulatory Visit: Payer: Self-pay | Admitting: Physician Assistant

## 2016-07-28 MED ORDER — MOMETASONE FUROATE 0.1 % EX CREA: 1.0000 "application " | TOPICAL_CREAM | Freq: Every day | CUTANEOUS | 0 refills | Status: DC

## 2016-07-28 MED ORDER — MOMETASONE FUROATE 0.1 % EX CREA: 1.0000 "application " | TOPICAL_CREAM | CUTANEOUS | 0 refills | Status: DC | PRN

## 2016-10-22 ENCOUNTER — Other Ambulatory Visit: Payer: Self-pay | Admitting: Physician Assistant

## 2016-10-22 DIAGNOSIS — E785 Hyperlipidemia, unspecified: Secondary | ICD-10-CM

## 2016-10-22 DIAGNOSIS — Z125 Encounter for screening for malignant neoplasm of prostate: Secondary | ICD-10-CM

## 2017-02-02 ENCOUNTER — Other Ambulatory Visit (HOSPITAL_COMMUNITY)
Admission: RE | Admit: 2017-02-02 | Discharge: 2017-02-02 | Disposition: A | Discharge status: Home / Self Care | Payer: Self-pay | Source: Ambulatory Visit | Attending: Physician Assistant | Admitting: Physician Assistant

## 2017-02-02 DIAGNOSIS — Z125 Encounter for screening for malignant neoplasm of prostate: Secondary | ICD-10-CM | POA: Insufficient documentation

## 2017-02-02 DIAGNOSIS — E785 Hyperlipidemia, unspecified: Secondary | ICD-10-CM | POA: Insufficient documentation

## 2017-02-02 LAB — COMPREHENSIVE METABOLIC PANEL
ALBUMIN: 4.1 g/dL (ref 3.5–5.0)
ALT: 21 U/L (ref 17–63)
ANION GAP: 10 (ref 5–15)
AST: 18 U/L (ref 15–41)
Alkaline Phosphatase: 52 U/L (ref 38–126)
BILIRUBIN TOTAL: 0.6 mg/dL (ref 0.3–1.2)
BUN: 15 mg/dL (ref 6–20)
CALCIUM: 9.2 mg/dL (ref 8.9–10.3)
CO2: 24 mmol/L (ref 22–32)
Chloride: 104 mmol/L (ref 101–111)
Creatinine, Ser: 0.92 mg/dL (ref 0.61–1.24)
GLUCOSE: 106 mg/dL — AB (ref 65–99)
Potassium: 4.5 mmol/L (ref 3.5–5.1)
Sodium: 138 mmol/L (ref 135–145)
TOTAL PROTEIN: 7.3 g/dL (ref 6.5–8.1)

## 2017-02-02 LAB — LIPID PANEL
CHOL/HDL RATIO: 5.1 ratio
Cholesterol: 216 mg/dL — ABNORMAL HIGH (ref 0–200)
HDL: 42 mg/dL (ref >40)
LDL Cholesterol: 152 mg/dL — ABNORMAL HIGH (ref 0–99)
TRIGLYCERIDES: 111 mg/dL (ref <150)
VLDL: 22 mg/dL (ref 0–40)

## 2017-02-02 LAB — PSA: PROSTATIC SPECIFIC ANTIGEN: 2.9 ng/mL (ref 0.00–4.00)

## 2017-02-03 ENCOUNTER — Ambulatory Visit: Payer: Self-pay | Admitting: Physician Assistant

## 2017-02-04 ENCOUNTER — Ambulatory Visit: Payer: Self-pay | Admitting: Physician Assistant

## 2017-02-10 ENCOUNTER — Encounter: Payer: Self-pay | Admitting: Physician Assistant

## 2017-02-10 ENCOUNTER — Ambulatory Visit: Payer: Self-pay | Admitting: Physician Assistant

## 2017-02-10 ENCOUNTER — Other Ambulatory Visit: Payer: Self-pay | Admitting: Physician Assistant

## 2017-02-10 VITALS — BP 126/72 | HR 97 | Temp 97.7°F | Ht 67.5 in | Wt 196.2 lb

## 2017-02-10 DIAGNOSIS — Z1211 Encounter for screening for malignant neoplasm of colon: Secondary | ICD-10-CM

## 2017-02-10 DIAGNOSIS — L409 Psoriasis, unspecified: Secondary | ICD-10-CM

## 2017-02-10 DIAGNOSIS — F17210 Nicotine dependence, cigarettes, uncomplicated: Secondary | ICD-10-CM

## 2017-02-10 DIAGNOSIS — E785 Hyperlipidemia, unspecified: Secondary | ICD-10-CM

## 2017-02-10 MED ORDER — SIMVASTATIN 20 MG PO TABS: 20.0000 mg | ORAL_TABLET | Freq: Every day | ORAL | 4 refills | Status: DC

## 2017-02-10 NOTE — Patient Instructions (Signed)
Fat and Cholesterol Restricted Diet High levels of fat and cholesterol in your blood may lead to various health problems, such as diseases of the heart, blood vessels, gallbladder, liver, and pancreas. Fats are concentrated sources of energy that come in various forms. Certain types of fat, including saturated fat, may be harmful in excess. Cholesterol is a substance needed by your body in small amounts. Your body makes all the cholesterol it needs. Excess cholesterol comes from the food you eat. When you have high levels of cholesterol and saturated fat in your blood, health problems can develop because the excess fat and cholesterol will gather along the walls of your blood vessels, causing them to narrow. Choosing the right foods will help you control your intake of fat and cholesterol. This will help keep the levels of these substances in your blood within normal limits and reduce your risk of disease. What is my plan? Your health care provider recommends that you:  Limit your fat intake to ______% or less of your total calories per day.  Limit the amount of cholesterol in your diet to less than _________mg per day.  Eat 20-30 grams of fiber each day.  What types of fat should I choose?  Choose healthy fats more often. Choose monounsaturated and polyunsaturated fats, such as olive and canola oil, flaxseeds, walnuts, almonds, and seeds.  Eat more omega-3 fats. Good choices include salmon, mackerel, sardines, tuna, flaxseed oil, and ground flaxseeds. Aim to eat fish at least two times a week.  Limit saturated fats. Saturated fats are primarily found in animal products, such as meats, butter, and cream. Plant sources of saturated fats include palm oil, palm kernel oil, and coconut oil.  Avoid foods with partially hydrogenated oils in them. These contain trans fats. Examples of foods that contain trans fats are stick margarine, some tub margarines, cookies, crackers, and other baked goods. What  general guidelines do I need to follow? These guidelines for healthy eating will help you control your intake of fat and cholesterol:  Check food labels carefully to identify foods with trans fats or high amounts of saturated fat.  Fill one half of your plate with vegetables and green salads.  Fill one fourth of your plate with whole grains. Look for the word "whole" as the first word in the ingredient list.  Fill one fourth of your plate with lean protein foods.  Limit fruit to two servings a day. Choose fruit instead of juice.  Eat more foods that contain fiber, such as apples, broccoli, carrots, beans, peas, and barley.  Eat more home-cooked food and less restaurant, buffet, and fast food.  Limit or avoid alcohol.  Limit foods high in starch and sugar.  Limit fried foods.  Cook foods using methods other than frying. Baking, boiling, grilling, and broiling are all great options.  Lose weight if you are overweight. Losing just 5-10% of your initial body weight can help your overall health and prevent diseases such as diabetes and heart disease.  What foods can I eat? Grains  Whole grains, such as whole wheat or whole grain breads, crackers, cereals, and pasta. Unsweetened oatmeal, bulgur, barley, quinoa, or brown rice. Corn or whole wheat flour tortillas. Vegetables  Fresh or frozen vegetables (raw, steamed, roasted, or grilled). Green salads. Fruits  All fresh, canned (in natural juice), or frozen fruits. Meats and other protein foods  Ground beef (85% or leaner), grass-fed beef, or beef trimmed of fat. Skinless chicken or turkey. Ground chicken or turkey.   Pork trimmed of fat. All fish and seafood. Eggs. Dried beans, peas, or lentils. Unsalted nuts or seeds. Unsalted canned or dry beans. Dairy  Low-fat dairy products, such as skim or 1% milk, 2% or reduced-fat cheeses, low-fat ricotta or cottage cheese, or plain low-fat yo Fats and oils  Tub margarines without trans  fats. Light or reduced-fat mayonnaise and salad dressings. Avocado. Olive, canola, sesame, or safflower oils. Natural peanut or almond butter (choose ones without added sugar and oil). The items listed above may not be a complete list of recommended foods or beverages. Contact your dietitian for more options. Foods to avoid Grains  White bread. White pasta. White rice. Cornbread. Bagels, pastries, and croissants. Crackers that contain trans fat. Vegetables  White potatoes. Corn. Creamed or fried vegetables. Vegetables in a cheese sauce. Fruits  Dried fruits. Canned fruit in light or heavy syrup. Fruit juice. Meats and other protein foods  Fatty cuts of meat. Ribs, chicken wings, bacon, sausage, bologna, salami, chitterlings, fatback, hot dogs, bratwurst, and packaged luncheon meats. Liver and organ meats. Dairy  Whole or 2% milk, cream, half-and-half, and cream cheese. Whole milk cheeses. Whole-fat or sweetened yogurt. Full-fat cheeses. Nondairy creamers and whipped toppings. Processed cheese, cheese spreads, or cheese curds. Beverages  Alcohol. Sweetened drinks (such as sodas, lemonade, and fruit drinks or punches). Fats and oils  Butter, stick margarine, lard, shortening, ghee, or bacon fat. Coconut, palm kernel, or palm oils. Sweets and desserts  Corn syrup, sugars, honey, and molasses. Candy. Jam and jelly. Syrup. Sweetened cereals. Cookies, pies, cakes, donuts, muffins, and ice cream. The items listed above may not be a complete list of foods and beverages to avoid. Contact your dietitian for more information. This information is not intended to replace advice given to you by your health care provider. Make sure you discuss any questions you have with your health care provider. Document Released: 04/06/2005 Document Revised: 04/27/2014 Document Reviewed: 07/05/2013 [employer] Interactive Patient Education  2017 [employer]  

## 2017-02-10 NOTE — Progress Notes (Signed)
BP 126/72 (BP Location: Left Arm, Patient Position: Sitting, Cuff Size: Normal)   Pulse 97   Temp 97.7 F (36.5 C)   Ht 5' 7.5" (1.715 m)   Wt 196 lb 4 oz (89 kg)   SpO2 98%   BMI 30.28 kg/m    Subjective:    Patient ID: Justin Washington, male    DOB: Feb 25, 1956, 61 y.o.   MRN: [id number]  HPI: Justin Washington is a 61 y.o. male presenting on 02/10/2017 for Hyperlipidemia and Follow-up   HPI   Pt started smoking again.    He feels well.  He does eat a lot of fatty foods and fried foods.  He does not exercise regularly.    Relevant past medical, surgical, family and social history reviewed and updated as indicated. Interim medical history since our last visit reviewed. Allergies and medications reviewed and updated.   Current Outpatient Prescriptions:  .  ibuprofen (ADVIL,MOTRIN) 200 MG tablet, Take 400 mg by mouth every 6 (six) hours as needed., Disp: , Rfl:   Review of Systems  Constitutional: Negative for appetite change, chills, diaphoresis, fatigue, fever and unexpected weight change.  HENT: Positive for hearing loss. Negative for congestion, dental problem, drooling, ear pain, facial swelling, mouth sores, sneezing, sore throat, trouble swallowing and voice change.   Eyes: Negative for pain, discharge, redness, itching and visual disturbance.  Respiratory: Negative for cough, choking, shortness of breath and wheezing.   Cardiovascular: Negative for chest pain, palpitations and leg swelling.  Gastrointestinal: Negative for abdominal pain, blood in stool, constipation, diarrhea and vomiting.  Endocrine: Negative for cold intolerance, heat intolerance and polydipsia.  Genitourinary: Negative for decreased urine volume, dysuria and hematuria.  Musculoskeletal: Negative for arthralgias, back pain and gait problem.  Skin: Negative for rash.  Allergic/Immunologic: Negative for environmental allergies.  Neurological: Negative for seizures, syncope, light-headedness and  headaches.  Hematological: Negative for adenopathy.  Psychiatric/Behavioral: Negative for agitation, dysphoric mood and suicidal ideas. The patient is not nervous/anxious.     Per HPI unless specifically indicated above     Objective:    BP 126/72 (BP Location: Left Arm, Patient Position: Sitting, Cuff Size: Normal)   Pulse 97   Temp 97.7 F (36.5 C)   Ht 5' 7.5" (1.715 m)   Wt 196 lb 4 oz (89 kg)   SpO2 98%   BMI 30.28 kg/m   Wt Readings from Last 3 Encounters:  02/10/17 196 lb 4 oz (89 kg)  06/03/16 196 lb 8 oz (89.1 kg)  03/04/16 189 lb 8 oz (86 kg)    Physical Exam  Constitutional: He is oriented to person, place, and time. He appears well-developed and well-nourished.  HENT:  Head: Normocephalic and atraumatic.  Neck: Neck supple.  Cardiovascular: Normal rate and regular rhythm.   Pulmonary/Chest: Effort normal and breath sounds normal. He has no wheezes.  Abdominal: Soft. Bowel sounds are normal. There is no hepatosplenomegaly. There is no tenderness.  Musculoskeletal: He exhibits no edema.  Lymphadenopathy:    He has no cervical adenopathy.  Neurological: He is alert and oriented to person, place, and time.  Skin: Skin is warm and dry.  Psychiatric: He has a normal mood and affect. His behavior is normal.  Vitals reviewed.   Results for orders placed or performed during the hospital encounter of 02/02/17  Lipid panel  Result Value Ref Range   Cholesterol 216 (H) 0 - 200 mg/dL   Triglycerides 119111 <147<150 mg/dL   HDL 42 >82>40  mg/dL   Total CHOL/HDL Ratio 5.1 RATIO   VLDL 22 0 - 40 mg/dL   LDL Cholesterol 562 (H) 0 - 99 mg/dL  Comprehensive metabolic panel  Result Value Ref Range   Sodium 138 135 - 145 mmol/L   Potassium 4.5 3.5 - 5.1 mmol/L   Chloride 104 101 - 111 mmol/L   CO2 24 22 - 32 mmol/L   Glucose, Bld 106 (H) 65 - 99 mg/dL   BUN 15 6 - 20 mg/dL   Creatinine, Ser 1.30 0.61 - 1.24 mg/dL   Calcium 9.2 8.9 - 86.5 mg/dL   Total Protein 7.3 6.5 - 8.1  g/dL   Albumin 4.1 3.5 - 5.0 g/dL   AST 18 15 - 41 U/L   ALT 21 17 - 63 U/L   Alkaline Phosphatase 52 38 - 126 U/L   Total Bilirubin 0.6 0.3 - 1.2 mg/dL   GFR calc non Af Amer >60 >60 mL/min   GFR calc Af Amer >60 >60 mL/min   Anion gap 10 5 - 15  PSA  Result Value Ref Range   Prostatic Specific Antigen 2.90 0.00 - 4.00 ng/mL      Assessment & Plan:   Encounter Diagnoses  Name Primary?  . Hyperlipidemia, unspecified hyperlipidemia type Yes  . Psoriasis   . Cigarette nicotine dependence without complication   . Special screening for malignant neoplasms, colon     -discussed risks benefits of cholesterol treatment with statin.  Pt agrees to simvastatin.  Gave reading information on low fat diet and counseled on regular exercise.   -counseled smoking cessation -gave ifobt for colon cancer screening -pt to follow up in 3 months.  RTO sooner prn

## 2017-05-06 ENCOUNTER — Other Ambulatory Visit (HOSPITAL_COMMUNITY)
Admission: RE | Admit: 2017-05-06 | Discharge: 2017-05-06 | Disposition: A | Discharge status: Home / Self Care | Payer: Self-pay | Source: Ambulatory Visit | Attending: Physician Assistant | Admitting: Physician Assistant

## 2017-05-06 DIAGNOSIS — E785 Hyperlipidemia, unspecified: Secondary | ICD-10-CM | POA: Insufficient documentation

## 2017-05-06 LAB — COMPREHENSIVE METABOLIC PANEL
ALT: 29 U/L (ref 17–63)
ANION GAP: 8 (ref 5–15)
AST: 25 U/L (ref 15–41)
Albumin: 4.3 g/dL (ref 3.5–5.0)
Alkaline Phosphatase: 49 U/L (ref 38–126)
BUN: 17 mg/dL (ref 6–20)
CALCIUM: 9.3 mg/dL (ref 8.9–10.3)
CO2: 25 mmol/L (ref 22–32)
Chloride: 105 mmol/L (ref 101–111)
Creatinine, Ser: 0.91 mg/dL (ref 0.61–1.24)
GFR calc non Af Amer: >60 mL/min (ref >60)
Glucose, Bld: 104 mg/dL — ABNORMAL HIGH (ref 65–99)
POTASSIUM: 4.9 mmol/L (ref 3.5–5.1)
Sodium: 138 mmol/L (ref 135–145)
TOTAL PROTEIN: 7.8 g/dL (ref 6.5–8.1)
Total Bilirubin: 0.5 mg/dL (ref 0.3–1.2)

## 2017-05-06 LAB — IFOBT (OCCULT BLOOD): IFOBT: NEGATIVE

## 2017-05-06 LAB — LIPID PANEL
CHOL/HDL RATIO: 5.2 ratio
Cholesterol: 201 mg/dL — ABNORMAL HIGH (ref 0–200)
HDL: 39 mg/dL — AB (ref >40)
LDL CALC: 150 mg/dL — AB (ref 0–99)
Triglycerides: 62 mg/dL (ref <150)
VLDL: 12 mg/dL (ref 0–40)

## 2017-05-12 ENCOUNTER — Encounter: Payer: Self-pay | Admitting: Physician Assistant

## 2017-05-12 ENCOUNTER — Ambulatory Visit: Payer: Self-pay | Admitting: Physician Assistant

## 2017-05-12 VITALS — BP 121/80 | HR 97 | Temp 97.4°F | Ht 67.5 in | Wt 195.5 lb

## 2017-05-12 DIAGNOSIS — E785 Hyperlipidemia, unspecified: Secondary | ICD-10-CM

## 2017-05-12 DIAGNOSIS — Z125 Encounter for screening for malignant neoplasm of prostate: Secondary | ICD-10-CM

## 2017-05-12 DIAGNOSIS — L409 Psoriasis, unspecified: Secondary | ICD-10-CM

## 2017-05-12 DIAGNOSIS — F17210 Nicotine dependence, cigarettes, uncomplicated: Secondary | ICD-10-CM | POA: Insufficient documentation

## 2017-05-12 DIAGNOSIS — Z5320 Procedure and treatment not carried out because of patient's decision for unspecified reasons: Secondary | ICD-10-CM

## 2017-05-12 NOTE — Patient Instructions (Signed)

## 2017-05-12 NOTE — Progress Notes (Signed)
BP 121/80 (BP Location: Right Arm, Patient Position: Sitting, Cuff Size: Normal)   Pulse 97   Temp (!) 97.4 F (36.3 C) (Other (Comment))   Ht 5' 7.5" (1.715 m)   Wt 195 lb 8 oz (88.7 kg)   SpO2 94%   BMI 30.17 kg/m    Subjective:    Patient ID: Justin Washington, male    DOB: [birth year], 62 y.o.   MRN: [id number]  HPI: Justin AbbotLewis R Pelfrey is a 62 y.o. male presenting on 05/12/2017 for Hyperlipidemia   HPI   Pt is still smoking but he has cut back on eating fatty foods.  He didn't take his simvastatin because he read the side effects and got scared.  He says he feels really well and he has no complaints today  Relevant past medical, surgical, family and social history reviewed and updated as indicated. Interim medical history since our last visit reviewed. Allergies and medications reviewed and updated.   Current Outpatient Medications:  .  B Complex Vitamins (B-COMPLEX/B-12 PO), Take 1 tablet by mouth daily., Disp: , Rfl:  .  ibuprofen (ADVIL,MOTRIN) 200 MG tablet, Take 400 mg by mouth every 6 (six) hours as needed., Disp: , Rfl:  .  Multiple Vitamin (MULTIVITAMIN WITH MINERALS) TABS tablet, Take 1 tablet by mouth daily., Disp: , Rfl:  .  Omega-3 Fatty Acids (FISH OIL) 1000 MG CAPS, Take 1 capsule by mouth daily., Disp: , Rfl:  .  simvastatin (ZOCOR) 20 MG tablet, Take 1 tablet (20 mg total) by mouth at bedtime. (Patient not taking: Reported on 05/12/2017), Disp: 30 tablet, Rfl: 4   Review of Systems  Constitutional: Negative for appetite change, chills, diaphoresis, fatigue, fever and unexpected weight change.  HENT: Negative for congestion, dental problem, drooling, ear pain, facial swelling, hearing loss, mouth sores, sneezing, sore throat, trouble swallowing and voice change.   Eyes: Negative for pain, discharge, redness, itching and visual disturbance.  Respiratory: Negative for cough, choking, shortness of breath and wheezing.   Cardiovascular: Negative for chest pain,  palpitations and leg swelling.  Gastrointestinal: Negative for abdominal pain, blood in stool, constipation, diarrhea and vomiting.  Endocrine: Negative for cold intolerance, heat intolerance and polydipsia.  Genitourinary: Negative for decreased urine volume, dysuria and hematuria.  Musculoskeletal: Negative for arthralgias, back pain and gait problem.  Skin: Negative for rash.  Allergic/Immunologic: Negative for environmental allergies.  Neurological: Negative for seizures, syncope, light-headedness and headaches.  Hematological: Negative for adenopathy.  Psychiatric/Behavioral: Negative for agitation, dysphoric mood and suicidal ideas. The patient is not nervous/anxious.     Per HPI unless specifically indicated above     Objective:    BP 121/80 (BP Location: Right Arm, Patient Position: Sitting, Cuff Size: Normal)   Pulse 97   Temp (!) 97.4 F (36.3 C) (Other (Comment))   Ht 5' 7.5" (1.715 m)   Wt 195 lb 8 oz (88.7 kg)   SpO2 94%   BMI 30.17 kg/m   Wt Readings from Last 3 Encounters:  05/12/17 195 lb 8 oz (88.7 kg)  02/10/17 196 lb 4 oz (89 kg)  06/03/16 196 lb 8 oz (89.1 kg)    Physical Exam  Constitutional: He is oriented to person, place, and time. He appears well-developed and well-nourished.  HENT:  Head: Normocephalic and atraumatic.  Neck: Neck supple.  Cardiovascular: Normal rate and regular rhythm.  Pulmonary/Chest: Effort normal and breath sounds normal. He has no wheezes.  Abdominal: Soft. Bowel sounds are normal. There is no hepatosplenomegaly.  There is no tenderness.  Musculoskeletal: He exhibits no edema.  Lymphadenopathy:    He has no cervical adenopathy.  Neurological: He is alert and oriented to person, place, and time.  Skin: Skin is warm and dry.  Psychiatric: He has a normal mood and affect. His behavior is normal.  Vitals reviewed.   Results for orders placed or performed during the hospital encounter of 05/06/17  Lipid panel  Result Value  Ref Range   Cholesterol 201 (H) 0 - 200 mg/dL   Triglycerides 62 <604 mg/dL   HDL 39 (L) >54 mg/dL   Total CHOL/HDL Ratio 5.2 RATIO   VLDL 12 0 - 40 mg/dL   LDL Cholesterol 098 (H) 0 - 99 mg/dL  Comprehensive metabolic panel  Result Value Ref Range   Sodium 138 135 - 145 mmol/L   Potassium 4.9 3.5 - 5.1 mmol/L   Chloride 105 101 - 111 mmol/L   CO2 25 22 - 32 mmol/L   Glucose, Bld 104 (H) 65 - 99 mg/dL   BUN 17 6 - 20 mg/dL   Creatinine, Ser 1.19 0.61 - 1.24 mg/dL   Calcium 9.3 8.9 - 14.7 mg/dL   Total Protein 7.8 6.5 - 8.1 g/dL   Albumin 4.3 3.5 - 5.0 g/dL   AST 25 15 - 41 U/L   ALT 29 17 - 63 U/L   Alkaline Phosphatase 49 38 - 126 U/L   Total Bilirubin 0.5 0.3 - 1.2 mg/dL   GFR calc non Af Amer >60 >60 mL/min   GFR calc Af Amer >60 >60 mL/min   Anion gap 8 5 - 15      Assessment & Plan:   Encounter Diagnoses  Name Primary?  . Hyperlipidemia, unspecified hyperlipidemia type Yes  . Statin declined   . Cigarette nicotine dependence without complication   . Psoriasis     -reviewed labs with pt -Discussed statins and hyperlipidemia and risks of untreated including increased risk of stroke and heart attack.  Discussed possible side effects of medication.  Pt declines to take medication at this time. Pt given reading information.  He is encouraged to continue lowfat diet and to get regular exercise -counseled smoking cessation -pt to follow up 8 months.  RTO sooner prn

## 2017-09-06 ENCOUNTER — Emergency Department (HOSPITAL_COMMUNITY)
Admission: EM | Admit: 2017-09-06 | Discharge: 2017-09-06 | Disposition: A | Discharge status: Home / Self Care | Payer: Self-pay | Attending: Emergency Medicine | Admitting: Emergency Medicine

## 2017-09-06 ENCOUNTER — Emergency Department (HOSPITAL_COMMUNITY): Payer: Self-pay

## 2017-09-06 ENCOUNTER — Other Ambulatory Visit: Payer: Self-pay

## 2017-09-06 ENCOUNTER — Encounter (HOSPITAL_COMMUNITY): Payer: Self-pay

## 2017-09-06 DIAGNOSIS — F17210 Nicotine dependence, cigarettes, uncomplicated: Secondary | ICD-10-CM | POA: Insufficient documentation

## 2017-09-06 DIAGNOSIS — K2980 Duodenitis without bleeding: Secondary | ICD-10-CM | POA: Insufficient documentation

## 2017-09-06 LAB — COMPREHENSIVE METABOLIC PANEL
ALK PHOS: 66 U/L (ref 38–126)
ALT: 65 U/L — AB (ref 17–63)
ANION GAP: 12 (ref 5–15)
AST: 29 U/L (ref 15–41)
Albumin: 3.7 g/dL (ref 3.5–5.0)
BILIRUBIN TOTAL: 1.2 mg/dL (ref 0.3–1.2)
BUN: 21 mg/dL — AB (ref 6–20)
CALCIUM: 8.6 mg/dL — AB (ref 8.9–10.3)
CO2: 21 mmol/L — ABNORMAL LOW (ref 22–32)
CREATININE: 0.96 mg/dL (ref 0.61–1.24)
Chloride: 101 mmol/L (ref 101–111)
GFR calc Af Amer: >60 mL/min (ref >60)
GFR calc non Af Amer: >60 mL/min (ref >60)
GLUCOSE: 107 mg/dL — AB (ref 65–99)
Potassium: 3.8 mmol/L (ref 3.5–5.1)
Sodium: 134 mmol/L — ABNORMAL LOW (ref 135–145)
TOTAL PROTEIN: 7.2 g/dL (ref 6.5–8.1)

## 2017-09-06 LAB — CBC WITH DIFFERENTIAL/PLATELET
BASOS ABS: 0 10*3/uL (ref 0.0–0.1)
BASOS PCT: 0 %
Eosinophils Absolute: 0.1 10*3/uL (ref 0.0–0.7)
Eosinophils Relative: 1 %
HEMATOCRIT: 47.4 % (ref 39.0–52.0)
Hemoglobin: 16.1 g/dL (ref 13.0–17.0)
LYMPHS PCT: 7 %
Lymphs Abs: 1.3 10*3/uL (ref 0.7–4.0)
MCH: 30.3 pg (ref 26.0–34.0)
MCHC: 34 g/dL (ref 30.0–36.0)
MCV: 89.1 fL (ref 78.0–100.0)
Monocytes Absolute: 1.4 10*3/uL — ABNORMAL HIGH (ref 0.1–1.0)
Monocytes Relative: 7 %
NEUTROS ABS: 16.1 10*3/uL — AB (ref 1.7–7.7)
Neutrophils Relative %: 85 %
PLATELETS: 375 10*3/uL (ref 150–400)
RBC: 5.32 MIL/uL (ref 4.22–5.81)
RDW: 14.1 % (ref 11.5–15.5)
WBC: 18.8 10*3/uL — AB (ref 4.0–10.5)

## 2017-09-06 LAB — URINALYSIS, ROUTINE W REFLEX MICROSCOPIC
Bacteria, UA: NONE SEEN
Bilirubin Urine: NEGATIVE
Glucose, UA: NEGATIVE mg/dL
Ketones, ur: 80 mg/dL — AB
LEUKOCYTES UA: NEGATIVE
Nitrite: NEGATIVE
Protein, ur: NEGATIVE mg/dL
SPECIFIC GRAVITY, URINE: 1.027 (ref 1.005–1.030)
pH: 5 (ref 5.0–8.0)

## 2017-09-06 LAB — LIPASE, BLOOD: Lipase: 103 U/L — ABNORMAL HIGH (ref 11–51)

## 2017-09-06 MED ORDER — SUCRALFATE 1 G PO TABS: 1.0000 g | ORAL_TABLET | Freq: Four times a day (QID) | ORAL | 0 refills | Status: DC

## 2017-09-06 MED ORDER — SODIUM CHLORIDE 0.9 % IV SOLN
Freq: Once | INTRAVENOUS | Status: AC
  Administered 2017-09-06: 08:00:00 via INTRAVENOUS

## 2017-09-06 MED ORDER — GLYCOPYRROLATE 0.2 MG/ML IJ SOLN
0.1000 mg | Freq: Once | INTRAMUSCULAR | Status: AC
  Administered 2017-09-06: 0.1 mg via INTRAVENOUS
  Filled 2017-09-06: qty 1

## 2017-09-06 MED ORDER — HYDROCODONE-ACETAMINOPHEN 5-325 MG PO TABS: 2.0000 | ORAL_TABLET | ORAL | 0 refills | Status: DC | PRN

## 2017-09-06 MED ORDER — SODIUM CHLORIDE 0.9 % IV BOLUS
1000.0000 mL | Freq: Once | INTRAVENOUS | Status: AC
  Administered 2017-09-06: 1000 mL via INTRAVENOUS

## 2017-09-06 MED ORDER — FAMOTIDINE IN NACL 20-0.9 MG/50ML-% IV SOLN
20.0000 mg | Freq: Once | INTRAVENOUS | Status: AC
  Administered 2017-09-06: 20 mg via INTRAVENOUS
  Filled 2017-09-06: qty 50

## 2017-09-06 MED ORDER — IOPAMIDOL (ISOVUE-300) INJECTION 61%
100.0000 mL | Freq: Once | INTRAVENOUS | Status: AC | PRN
  Administered 2017-09-06: 100 mL via INTRAVENOUS

## 2017-09-06 MED ORDER — ONDANSETRON HCL 4 MG/2ML IJ SOLN
4.0000 mg | Freq: Once | INTRAMUSCULAR | Status: AC
  Administered 2017-09-06: 4 mg via INTRAVENOUS
  Filled 2017-09-06: qty 2

## 2017-09-06 MED ORDER — MORPHINE SULFATE (PF) 4 MG/ML IV SOLN: 4.0000 mg | INTRAVENOUS | Status: DC | PRN

## 2017-09-06 MED ORDER — ONDANSETRON 4 MG PO TBDP: 4.0000 mg | ORAL_TABLET | Freq: Three times a day (TID) | ORAL | 0 refills | Status: DC | PRN

## 2017-09-06 MED ORDER — OMEPRAZOLE 20 MG PO CPDR: 20.0000 mg | DELAYED_RELEASE_CAPSULE | Freq: Two times a day (BID) | ORAL | 1 refills | Status: DC

## 2017-09-06 MED ORDER — HYDROMORPHONE HCL 1 MG/ML IJ SOLN: 1.0000 mg | Freq: Once | INTRAMUSCULAR | Status: DC

## 2017-09-06 NOTE — ED Provider Notes (Signed)
[facility] EMERGENCY DEPARTMENT Provider Note   CSN: [id number] Arrival date & time: 09/06/17  1191     History   Chief Complaint Chief Complaint  Patient presents with  . Abdominal Pain    HPI Justin Washington is a 62 y.o. male.  Complaint is abdominal pain.  HPI: 62 year old male.  No history of significant past medical history.  Hypercholesterolemia.  Takes simvastatin. Early smoker 1 pack/day.  No alcohol use for over 4 years after past moderate use.  Described abdominal pain with nausea since Saturday, 2 days ago.  Started slowly as a burning in his abdomen.  He is nauseated.  He feels pain.  Is not more uncomfortable after p.o. intake.  Normal bowel movements.  No past surgical history to his abdomen.  Still having passage of gas and bowel movements.  No dark stools.  Past Medical History:  Diagnosis Date  . Chest pain   . Psoriasis   . Substance abuse [facility])     Patient Active Problem List   Diagnosis Date Noted  . Cigarette nicotine dependence without complication 05/12/2017  . Hyperlipidemia 03/04/2016  . Psoriasis 02/04/2016    Past Surgical History:  Procedure Laterality Date  . HERNIA REPAIR Left mid 1990's        Home Medications    Prior to Admission medications   Medication Sig Start Date End Date Taking? Authorizing Provider  ibuprofen (ADVIL,MOTRIN) 200 MG tablet Take 400 mg by mouth every 6 (six) hours as needed.   Yes [provider]  HYDROcodone-acetaminophen (NORCO/VICODIN) 5-325 MG tablet Take 2 tablets by mouth every 4 (four) hours as needed. 09/06/17   Rolland Porter, MD  omeprazole (PRILOSEC) 20 MG capsule Take 1 capsule (20 mg total) by mouth 2 (two) times daily. 09/06/17   Rolland Porter, MD  ondansetron (ZOFRAN ODT) 4 MG disintegrating tablet Take 1 tablet (4 mg total) by mouth every 8 (eight) hours as needed for nausea. 09/06/17   Rolland Porter, MD  simvastatin (ZOCOR) 20 MG tablet Take 1 tablet (20 mg total) by mouth at  bedtime. Patient not taking: Reported on 05/12/2017 02/10/17   Jacquelin Hawking, PA-C  sucralfate (CARAFATE) 1 g tablet Take 1 tablet (1 g total) by mouth 4 (four) times daily. 09/06/17   Rolland Porter, MD    Family History Family History  Problem Relation Age of Onset  . Cancer Mother 59       esophogeal cancer  . Hypertension Father   . Intracerebral hemorrhage Father   . Cancer Brother 50       liver cancer  . Diabetes Brother     Social History Social History   Tobacco Use  . Smoking status: Current Some Day Smoker    Packs/day: 1.00    Years: 41.00    Pack years: 41.00    Types: Cigarettes  . Smokeless tobacco: Never Used  . Tobacco comment: started smoking again 03-01-16, had quit 4 months proir  to that  Substance Use Topics  . 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 Drug use: No    Types: Marijuana, Opium, Cocaine, "Crack" cocaine    Comment: clean since 05/10/2012     Allergies   Hydrocodone and Other   Review of Systems Review of Systems  Constitutional: Negative for appetite change, chills, diaphoresis, fatigue and fever.  HENT: Negative for mouth sores, sore throat and trouble swallowing.   Eyes: Negative for visual disturbance.  Respiratory: Negative for cough, chest tightness, shortness of breath and wheezing.   Cardiovascular: Negative for chest pain.  Gastrointestinal: Positive for abdominal pain and nausea. Negative for abdominal distention, diarrhea and vomiting.  Endocrine: Negative for polydipsia, polyphagia and polyuria.  Genitourinary: Negative for dysuria, frequency and hematuria.  Musculoskeletal: Negative for gait problem.  Skin: Negative for color change, pallor and rash.  Neurological: Negative for dizziness, syncope, light-headedness and headaches.  Hematological: Does not bruise/bleed easily.  Psychiatric/Behavioral: Negative for behavioral problems and confusion.     Physical  Exam Updated Vital Signs BP 115/79   Pulse (!) 103   Temp 98 F (36.7 C)   Resp 18   Ht  (1.727 m)   Wt 88.9 kg (196 lb)   SpO2 94%   BMI 29.80 kg/m   Physical Exam  Constitutional: He is oriented to person, place, and time. He appears well-developed and well-nourished. No distress.  HENT:  Head: Normocephalic.  Eyes: Pupils are equal, round, and reactive to light. Conjunctivae are normal. No scleral icterus.  Neck: Normal range of motion. Neck supple. No thyromegaly present.  Cardiovascular: Normal rate and regular rhythm. Exam reveals no gallop and no friction rub.  No murmur heard. Pulmonary/Chest: Effort normal and breath sounds normal. No respiratory distress. He has no wheezes. He has no rales.  Abdominal: Soft. Bowel sounds are normal. He exhibits no distension. There is no tenderness. There is no rebound.  Epigastric tenderness.  No peritoneal irritation.  Lower abdomen benign.  Musculoskeletal: Normal range of motion.  Neurological: He is alert and oriented to person, place, and time.  Skin: Skin is warm and dry. No rash noted.  Psychiatric: He has a normal mood and affect. His behavior is normal.     ED Treatments / Results  Labs (all labs ordered are listed, but only abnormal results are displayed) Labs Reviewed  CBC WITH DIFFERENTIAL/PLATELET - Abnormal; Notable for the following components:      Result Value   WBC 18.8 (*)    Neutro Abs 16.1 (*)    Monocytes Absolute 1.4 (*)    All other components within normal limits  COMPREHENSIVE METABOLIC PANEL - Abnormal; Notable for the following components:   Sodium 134 (*)    CO2 21 (*)    Glucose, Bld 107 (*)    BUN 21 (*)    Calcium 8.6 (*)    ALT 65 (*)    All other components within normal limits  URINALYSIS, ROUTINE W REFLEX MICROSCOPIC - Abnormal; Notable for the following components:   APPearance HAZY (*)    Hgb urine dipstick MODERATE (*)    Ketones, ur 80 (*)    All other components within  normal limits  LIPASE, BLOOD - Abnormal; Notable for the following components:   Lipase 103 (*)    All other components within normal limits    EKG None  Radiology Ct Abdomen Pelvis W Contrast  Result Date: 09/06/2017 CLINICAL DATA:  Mid abdominal pain and burning for 3 days with nausea. EXAM: CT ABDOMEN AND PELVIS WITH CONTRAST TECHNIQUE: Multidetector CT imaging of the abdomen and pelvis was performed using the standard protocol following bolus administration of intravenous contrast. CONTRAST:  ISOVUE-300 IOPAMIDOL (ISOVUE-300) INJECTION 61% COMPARISON:  None. FINDINGS: Lower chest: Small bilateral pleural effusions. Left greater than right basilar atelectasis. Coronary artery atherosclerosis. Hepatobiliary: No focal liver abnormality is seen. No gallstones, gallbladder wall thickening, or biliary dilatation. Pancreas: No pancreatic ductal dilatation. Diffuse retroperitoneal inflammatory stranding does  not appear centered about the pancreas. Spleen: Unremarkable. Adrenals/Urinary Tract: Unremarkable adrenal glands. No evidence of renal calculi or hydronephrosis. Subcentimeter low-density lesion in the right kidney, too small to fully characterize. Stomach/Bowel: The stomach is within normal limits. There is no evidence of bowel obstruction. Retroperitoneal stranding extends to the third portion of the duodenum. Left-sided colonic diverticulosis is noted without evidence of diverticulitis. The appendix is unremarkable. Vascular/Lymphatic: Abdominal aortic atherosclerosis without aneurysm. Increased number of subcentimeter retroperitoneal lymph nodes, likely reactive. Reproductive: Mild prostatic enlargement. Other: Small volume intraperitoneal free fluid. Moderate diffuse retroperitoneal inflammatory stranding. Small fat-containing right inguinal hernia. Musculoskeletal: Mild thoracolumbar disc degeneration. No suspicious osseous lesion. IMPRESSION: 1. Moderate inflammatory stranding throughout  the retroperitoneum. This is of uncertain etiology, however duodenitis is a consideration. While the inflammation is not focused around the pancreas, pancreatitis remains a less likely consideration and correlation with serum lipase is recommended. 2. Small pleural effusions and bibasilar atelectasis. 3. Colonic diverticulosis. 4.  Aortic Atherosclerosis (ICD10-I70.0). Electronically Signed   By: Sebastian Ache M.D.   On: 09/06/2017 10:00    Procedures Procedures (including critical care time)  Medications Ordered in ED Medications  morphine 4 MG/ML injection 4 mg (has no administration in time range)  HYDROmorphone (DILAUDID) injection 1 mg (1 mg Intravenous Refused 09/06/17 1130)  sodium chloride 0.9 % bolus 1,000 mL (0 mLs Intravenous Stopped 09/06/17 0930)  0.9 %  sodium chloride infusion ( Intravenous Stopped 09/06/17 1143)  ondansetron (ZOFRAN) injection 4 mg (4 mg Intravenous Given 09/06/17 0807)  glycopyrrolate (ROBINUL) injection 0.1 mg (0.1 mg Intravenous Given 09/06/17 0807)  iopamidol (ISOVUE-300) 61 % injection 100 mL (100 mLs Intravenous Contrast Given 09/06/17 0901)  famotidine (PEPCID) IVPB 20 mg premix (0 mg Intravenous Stopped 09/06/17 1041)     Initial Impression / Assessment and Plan / ED Course  I have reviewed the triage vital signs and the nursing notes.  Pertinent labs & imaging results that were available during my care of the patient were reviewed by me and considered in my medical decision making (see chart for details).   K[place]ai ptosis.  Normal hepatobiliary pancreatic enzymes.  ALT slightly high at 65.  Lipase 103.  CT scan shows probable duodenitis with retroperitoneal inflammation.  Otherwise normal pancreas.  Patient given IV medications for pain and nausea.  Given IV Pepcid.  I think he is appropriate for outpatient treatment.  I discussed with him that this is very likely duodenitis and likely acid related.  Plan PPI twice daily.  Carafate.  Clear liquid diet.  GI  follow-up.  Vicodin for pain.  Zofran for nausea.  Avoid alcohol, tobacco, caffeine, anti-inflammatories.  Recheck with any new or worsening symptoms.   Final Clinical Impressions(s) / ED Diagnoses   Final diagnoses:  Duodenitis    ED Discharge Orders        Ordered    ondansetron (ZOFRAN ODT) 4 MG disintegrating tablet  Every 8 hours PRN     09/06/17 1120    HYDROcodone-acetaminophen (NORCO/VICODIN) 5-325 MG tablet  Every 4 hours PRN     09/06/17 1120    omeprazole (PRILOSEC) 20 MG capsule  2 times daily     09/06/17 1120    sucralfate (CARAFATE) 1 g tablet  4 times daily     09/06/17 1120       Rolland Porter, MD 09/06/17 1422

## 2017-09-06 NOTE — Discharge Instructions (Signed)
NO Smoking. No BC powder or advil Call Dr. Jena Gauss for follow up appointment Return to ER with worsening.

## 2017-09-06 NOTE — ED Triage Notes (Signed)
Pt c/o generalized abd pain, n/v/d since Saturday.

## 2017-09-14 ENCOUNTER — Encounter: Payer: Self-pay | Admitting: Gastroenterology

## 2017-09-15 ENCOUNTER — Encounter: Payer: Self-pay | Admitting: Physician Assistant

## 2017-09-15 ENCOUNTER — Ambulatory Visit: Payer: Self-pay | Admitting: Physician Assistant

## 2017-09-15 VITALS — BP 116/79 | HR 83 | Temp 98.1°F | Ht 67.5 in | Wt 185.5 lb

## 2017-09-15 DIAGNOSIS — E785 Hyperlipidemia, unspecified: Secondary | ICD-10-CM

## 2017-09-15 DIAGNOSIS — Z125 Encounter for screening for malignant neoplasm of prostate: Secondary | ICD-10-CM

## 2017-09-15 DIAGNOSIS — I2510 Atherosclerotic heart disease of native coronary artery without angina pectoris: Secondary | ICD-10-CM | POA: Insufficient documentation

## 2017-09-15 MED ORDER — SIMVASTATIN 10 MG PO TABS: 10.0000 mg | ORAL_TABLET | Freq: Every day | ORAL | 4 refills | Status: DC

## 2017-09-15 NOTE — Progress Notes (Signed)
BP 116/79 (BP Location: Right Arm, Patient Position: Sitting, Cuff Size: Normal)   Pulse 83   Temp 98.1 F (36.7 C) (Other (Comment))   Ht 5' 7.5" (1.715 m)   Wt 185 lb 8 oz (84.1 kg)   SpO2 96%   BMI 28.62 kg/m    Subjective:    Patient ID: Justin Washington, male    DOB: Jan 15, 1956, 62 y.o.   MRN: [id number]  HPI: Justin Washington is a 62 y.o. male presenting on 09/15/2017 for Hyperlipidemia   HPI   Pt never took his simvastatin because he was afraid of side effects  He is taking the carafate and omeprazole he received in the ER recently.   His stomach is feeling better.   Pt in ER recently and CT showed coronary atherosclerosis.  Discussed with pt and that the statin can help reduce chances of MI and premature death.   Discussed risks and benefits of statin therapy.  Relevant past medical, surgical, family and social history reviewed and updated as indicated. Interim medical history since our last visit reviewed. Allergies and medications reviewed and updated.   Current Outpatient Medications:  .  ibuprofen (ADVIL,MOTRIN) 200 MG tablet, Take 400 mg by mouth every 6 (six) hours as needed., Disp: , Rfl:  .  omeprazole (PRILOSEC) 20 MG capsule, Take 1 capsule (20 mg total) by mouth 2 (two) times daily., Disp: 60 capsule, Rfl: 1 .  sucralfate (CARAFATE) 1 g tablet, Take 1 tablet (1 g total) by mouth 4 (four) times daily., Disp: 60 tablet, Rfl: 0 .  HYDROcodone-acetaminophen (NORCO/VICODIN) 5-325 MG tablet, Take 2 tablets by mouth every 4 (four) hours as needed. (Patient not taking: Reported on 09/15/2017), Disp: 10 tablet, Rfl: 0 .  ondansetron (ZOFRAN ODT) 4 MG disintegrating tablet, Take 1 tablet (4 mg total) by mouth every 8 (eight) hours as needed for nausea. (Patient not taking: Reported on 09/15/2017), Disp: 6 tablet, Rfl: 0 .  simvastatin (ZOCOR) 20 MG tablet, Take 1 tablet (20 mg total) by mouth at bedtime. (Patient not taking: Reported on 05/12/2017), Disp: 30 tablet, Rfl:  4   Review of Systems  Constitutional: Negative for appetite change, chills, diaphoresis, fatigue, fever and unexpected weight change.  HENT: Negative for congestion, dental problem, drooling, ear pain, facial swelling, hearing loss, mouth sores, sneezing, sore throat, trouble swallowing and voice change.   Eyes: Negative for pain, discharge, redness, itching and visual disturbance.  Respiratory: Negative for cough, choking, shortness of breath and wheezing.   Cardiovascular: Negative for chest pain, palpitations and leg swelling.  Gastrointestinal: Negative for abdominal pain, blood in stool, constipation, diarrhea and vomiting.  Endocrine: Negative for cold intolerance, heat intolerance and polydipsia.  Genitourinary: Negative for decreased urine volume, dysuria and hematuria.  Musculoskeletal: Negative for arthralgias, back pain and gait problem.  Skin: Negative for rash.  Allergic/Immunologic: Negative for environmental allergies.  Neurological: Negative for seizures, syncope, light-headedness and headaches.  Hematological: Negative for adenopathy.  Psychiatric/Behavioral: Negative for agitation, dysphoric mood and suicidal ideas. The patient is not nervous/anxious.     Per HPI unless specifically indicated above     Objective:    BP 116/79 (BP Location: Right Arm, Patient Position: Sitting, Cuff Size: Normal)   Pulse 83   Temp 98.1 F (36.7 C) (Other (Comment))   Ht 5' 7.5" (1.715 m)   Wt 185 lb 8 oz (84.1 kg)   SpO2 96%   BMI 28.62 kg/m   Wt Readings from Last 3 Encounters:  09/15/17 185 lb 8 oz (84.1 kg)  09/06/17 196 lb (88.9 kg)  05/12/17 195 lb 8 oz (88.7 kg)    Physical Exam  Constitutional: He is oriented to person, place, and time. He appears well-developed and well-nourished.  HENT:  Head: Normocephalic and atraumatic.  Neck: Neck supple.  Cardiovascular: Normal rate and regular rhythm.  Pulmonary/Chest: Effort normal and breath sounds normal. He has no  wheezes.  Abdominal: Soft. Bowel sounds are normal. There is no hepatosplenomegaly. There is no tenderness.  Musculoskeletal: He exhibits no edema.  Lymphadenopathy:    He has no cervical adenopathy.  Neurological: He is alert and oriented to person, place, and time.  Skin: Skin is warm and dry.  Psychiatric: He has a normal mood and affect. His behavior is normal.  Vitals reviewed.       Assessment & Plan:    Encounter Diagnoses  Name Primary?  . Hyperlipidemia, unspecified hyperlipidemia type Yes  . Atherosclerosis of native coronary artery of native heart without angina pectoris   . Screening for prostate cancer     -Recommended pt refill omeprazole when it runs out -Pt will start  simvastatin and follow lowfat diet -he has appointment scheduled with GI due to recent ER visit -pt will Follow up in 3 months.  RTO sooner prn

## 2017-12-02 ENCOUNTER — Ambulatory Visit: Payer: Self-pay | Admitting: Gastroenterology

## 2017-12-16 ENCOUNTER — Ambulatory Visit: Payer: Self-pay | Admitting: Physician Assistant

## 2017-12-27 ENCOUNTER — Other Ambulatory Visit (HOSPITAL_COMMUNITY)
Admission: RE | Admit: 2017-12-27 | Discharge: 2017-12-27 | Disposition: A | Discharge status: Home / Self Care | Payer: Self-pay | Source: Ambulatory Visit | Attending: Physician Assistant | Admitting: Physician Assistant

## 2017-12-27 DIAGNOSIS — E785 Hyperlipidemia, unspecified: Secondary | ICD-10-CM | POA: Insufficient documentation

## 2017-12-27 DIAGNOSIS — I2510 Atherosclerotic heart disease of native coronary artery without angina pectoris: Secondary | ICD-10-CM | POA: Insufficient documentation

## 2017-12-27 DIAGNOSIS — Z125 Encounter for screening for malignant neoplasm of prostate: Secondary | ICD-10-CM | POA: Insufficient documentation

## 2017-12-27 LAB — COMPREHENSIVE METABOLIC PANEL
ALT: 15 U/L (ref 0–44)
AST: 16 U/L (ref 15–41)
Albumin: 4.3 g/dL (ref 3.5–5.0)
Alkaline Phosphatase: 42 U/L (ref 38–126)
Anion gap: 6 (ref 5–15)
BUN: 14 mg/dL (ref 8–23)
CHLORIDE: 107 mmol/L (ref 98–111)
CO2: 25 mmol/L (ref 22–32)
CREATININE: 0.81 mg/dL (ref 0.61–1.24)
Calcium: 9.1 mg/dL (ref 8.9–10.3)
Glucose, Bld: 105 mg/dL — ABNORMAL HIGH (ref 70–99)
POTASSIUM: 4.3 mmol/L (ref 3.5–5.1)
SODIUM: 138 mmol/L (ref 135–145)
TOTAL PROTEIN: 7.3 g/dL (ref 6.5–8.1)
Total Bilirubin: 0.4 mg/dL (ref 0.3–1.2)

## 2017-12-27 LAB — LIPID PANEL
Cholesterol: 139 mg/dL (ref 0–200)
HDL: 40 mg/dL — AB (ref >40)
LDL Cholesterol: 84 mg/dL (ref 0–99)
TRIGLYCERIDES: 74 mg/dL (ref <150)
Total CHOL/HDL Ratio: 3.5 RATIO
VLDL: 15 mg/dL (ref 0–40)

## 2017-12-27 LAB — PSA: Prostatic Specific Antigen: 3.61 ng/mL (ref 0.00–4.00)

## 2017-12-28 ENCOUNTER — Ambulatory Visit: Payer: Self-pay | Admitting: Physician Assistant

## 2017-12-28 ENCOUNTER — Encounter: Payer: Self-pay | Admitting: Physician Assistant

## 2017-12-28 VITALS — BP 122/68 | HR 84 | Temp 98.1°F | Ht 67.5 in | Wt 177.0 lb

## 2017-12-28 DIAGNOSIS — K4090 Unilateral inguinal hernia, without obstruction or gangrene, not specified as recurrent: Secondary | ICD-10-CM

## 2017-12-28 DIAGNOSIS — E785 Hyperlipidemia, unspecified: Secondary | ICD-10-CM

## 2017-12-28 MED ORDER — SILDENAFIL CITRATE 100 MG PO TABS: 50.0000 mg | ORAL_TABLET | Freq: Every day | ORAL | 0 refills | Status: DC | PRN

## 2017-12-28 NOTE — Patient Instructions (Signed)
Sildenafil tablets (Viagra) What is this medicine? SILDENAFIL (sil DEN a fil) is used to treat erection problems in men. This medicine may be used for other purposes; ask your health care provider or pharmacist if you have questions. COMMON BRAND NAME(S): Viagra What should I tell my health care provider before I take this medicine? They need to know if you have any of these conditions: -bleeding disorders -eye or vision problems, including a rare inherited eye disease called retinitis pigmentosa -anatomical deformation of the penis, Peyronie's disease, or history of priapism (painful and prolonged erection) -heart disease, angina, a history of heart attack, irregular heart beats, or other heart problems -high or low blood pressure -history of blood diseases, like sickle cell anemia or leukemia -history of stomach bleeding -kidney disease -liver disease -stroke -an unusual or allergic reaction to sildenafil, other medicines, foods, dyes, or preservatives -pregnant or trying to get pregnant -breast-feeding How should I use this medicine? Take this medicine by mouth with a glass of water. Follow the directions on the prescription label. The dose is usually taken 1 hour before sexual activity. You should not take the dose more than once per day. Do not take your medicine more often than directed. Talk to your pediatrician regarding the use of this medicine in children. This medicine is not used in children for this condition. Overdosage: If you think you have taken too much of this medicine contact a poison control center or emergency room at once. NOTE: This medicine is only for you. Do not share this medicine with others. What if I miss a dose? This does not apply. Do not take double or extra doses. What may interact with this medicine? Do not take this medicine with any of the following medications: -cisapride -nitrates like amyl nitrite, isosorbide dinitrate, isosorbide mononitrate,  nitroglycerin -riociguat This medicine may also interact with the following medications: -antiviral medicines for HIV or AIDS -bosentan -certain medicines for benign prostatic hyperplasia (BPH) -certain medicines for blood pressure -certain medicines for fungal infections like ketoconazole and itraconazole -cimetidine -erythromycin -rifampin This list may not describe all possible interactions. Give your health care provider a list of all the medicines, herbs, non-prescription drugs, or dietary supplements you use. Also tell them if you smoke, drink alcohol, or use illegal drugs. Some items may interact with your medicine. What should I watch for while using this medicine? If you notice any changes in your vision while taking this drug, call your doctor or health care professional as soon as possible. Stop using this medicine and call your health care provider right away if you have a loss of sight in one or both eyes. Contact your doctor or health care professional right away if you have an erection that lasts longer than 4 hours or if it becomes painful. This may be a sign of a serious problem and must be treated right away to prevent permanent damage. If you experience symptoms of nausea, dizziness, chest pain or arm pain upon initiation of sexual activity after taking this medicine, you should refrain from further activity and call your doctor or health care professional as soon as possible. Do not drink alcohol to excess (examples, 5 glasses of wine or 5 shots of whiskey) when taking this medicine. When taken in excess, alcohol can increase your chances of getting a headache or getting dizzy, increasing your heart rate or lowering your blood pressure. Using this medicine does not protect you or your partner against HIV infection (the virus that causes   AIDS) or other sexually transmitted diseases. What side effects may I notice from receiving this medicine? Side effects that you should report  to your doctor or health care professional as soon as possible: -allergic reactions like skin rash, itching or hives, swelling of the face, lips, or tongue -breathing problems -changes in hearing -changes in vision -chest pain -fast, irregular heartbeat -prolonged or painful erection -seizures Side effects that usually do not require medical attention (report to your doctor or health care professional if they continue or are bothersome): -back pain -dizziness -flushing -headache -indigestion -muscle aches -nausea -stuffy or runny nose This list may not describe all possible side effects. Call your doctor for medical advice about side effects. You may report side effects to FDA at 1-800-FDA-1088. Where should I keep my medicine? Keep out of reach of children. Store at room temperature between 15 and 30 degrees C (59 and 86 degrees F). Throw away any unused medicine after the expiration date. NOTE: This sheet is a summary. It may not cover all possible information. If you have questions about this medicine, talk to your doctor, pharmacist, or health care provider.  2018 Elsevier/Gold Standard (2015-03-20 12:00:25)  

## 2017-12-28 NOTE — Progress Notes (Signed)
BP 122/68 (BP Location: Right Arm, Patient Position: Sitting, Cuff Size: Normal)   Pulse 84   Temp 98.1 F (36.7 C)   Ht 5' 7.5" (1.715 m)   Wt 177 lb (80.3 kg)   SpO2 95%   BMI 27.31 kg/m    Subjective:    Patient ID: Justin Washington, male    DOB: 05-15-1955, 62 y.o.   MRN: [id number]  HPI: Justin Washington is a 62 y.o. male presenting on 12/28/2017 for Hyperlipidemia   HPI  Pt is doing well.  He says he is planning to get married at the end of this month and expects to get on his wife's insurance at that time.    Pt states hernia R groin that bothers him at times.  He says he is trying to avoid lifting, but sometimes it comes out just with standing up.  He had surgery for a L hernia many years ago.  Pt is worried that he will not be able to perform sexually on his wedding night.  He says he is able to get erections now, but that he and his fiance have not had intercourse.  He says he is really fretting about the pressure and not wanting things to not work when the time comes.   Relevant past medical, surgical, family and social history reviewed and updated as indicated. Interim medical history since our last visit reviewed. Allergies and medications reviewed and updated.   Current Outpatient Medications:  .  ibuprofen (ADVIL,MOTRIN) 200 MG tablet, Take 400 mg by mouth every 6 (six) hours as needed., Disp: , Rfl:  .  simvastatin (ZOCOR) 10 MG tablet, Take 1 tablet (10 mg total) by mouth at bedtime., Disp: 30 tablet, Rfl: 4   Review of Systems  Constitutional: Negative for appetite change, chills, diaphoresis, fatigue, fever and unexpected weight change.  HENT: Negative for congestion, dental problem, drooling, ear pain, facial swelling, hearing loss, mouth sores, sneezing, sore throat, trouble swallowing and voice change.   Eyes: Negative for pain, discharge, redness, itching and visual disturbance.  Respiratory: Negative for cough, choking, shortness of breath and wheezing.     Cardiovascular: Negative for chest pain, palpitations and leg swelling.  Gastrointestinal: Negative for abdominal pain, blood in stool, constipation, diarrhea and vomiting.  Endocrine: Negative for cold intolerance, heat intolerance and polydipsia.  Genitourinary: Negative for decreased urine volume, dysuria and hematuria.  Musculoskeletal: Negative for arthralgias, back pain and gait problem.  Skin: Negative for rash.  Allergic/Immunologic: Negative for environmental allergies.  Neurological: Negative for seizures, syncope, light-headedness and headaches.  Hematological: Negative for adenopathy.  Psychiatric/Behavioral: Negative for agitation, dysphoric mood and suicidal ideas. The patient is not nervous/anxious.     Per HPI unless specifically indicated above     Objective:    BP 122/68 (BP Location: Right Arm, Patient Position: Sitting, Cuff Size: Normal)   Pulse 84   Temp 98.1 F (36.7 C)   Ht 5' 7.5" (1.715 m)   Wt 177 lb (80.3 kg)   SpO2 95%   BMI 27.31 kg/m   Wt Readings from Last 3 Encounters:  12/28/17 177 lb (80.3 kg)  09/15/17 185 lb 8 oz (84.1 kg)  09/06/17 196 lb (88.9 kg)    Physical Exam  Constitutional: He is oriented to person, place, and time. He appears well-developed and well-nourished.  HENT:  Head: Normocephalic and atraumatic.  Neck: Neck supple.  Cardiovascular: Normal rate and regular rhythm.  Pulmonary/Chest: Effort normal and breath sounds normal. He  has no wheezes.  Abdominal: Soft. Bowel sounds are normal. There is no hepatosplenomegaly. There is no tenderness. A hernia is present. Hernia confirmed positive in the right inguinal area.  Genitourinary:     Genitourinary Comments: Tender hernia palpable.  Not strangulated.   Musculoskeletal: He exhibits no edema.  Lymphadenopathy:    He has no cervical adenopathy.  Neurological: He is alert and oriented to person, place, and time.  Skin: Skin is warm and dry.  Psychiatric: He has a normal  mood and affect. His behavior is normal.  Vitals reviewed.   Results for orders placed or performed during the hospital encounter of 12/27/17  PSA  Result Value Ref Range   Prostatic Specific Antigen 3.61 0.00 - 4.00 ng/mL  Lipid panel  Result Value Ref Range   Cholesterol 139 0 - 200 mg/dL   Triglycerides 74 <161 mg/dL   HDL 40 (L) >09 mg/dL   Total CHOL/HDL Ratio 3.5 RATIO   VLDL 15 0 - 40 mg/dL   LDL Cholesterol 84 0 - 99 mg/dL  Comprehensive metabolic panel  Result Value Ref Range   Sodium 138 135 - 145 mmol/L   Potassium 4.3 3.5 - 5.1 mmol/L   Chloride 107 98 - 111 mmol/L   CO2 25 22 - 32 mmol/L   Glucose, Bld 105 (H) 70 - 99 mg/dL   BUN 14 8 - 23 mg/dL   Creatinine, Ser 6.04 0.61 - 1.24 mg/dL   Calcium 9.1 8.9 - 54.0 mg/dL   Total Protein 7.3 6.5 - 8.1 g/dL   Albumin 4.3 3.5 - 5.0 g/dL   AST 16 15 - 41 U/L   ALT 15 0 - 44 U/L   Alkaline Phosphatase 42 38 - 126 U/L   Total Bilirubin 0.4 0.3 - 1.2 mg/dL   GFR calc non Af Amer >60 >60 mL/min   GFR calc Af Amer >60 >60 mL/min   Anion gap 6 5 - 15      Assessment & Plan:   Encounter Diagnoses  Name Primary?  . Hyperlipidemia, unspecified hyperlipidemia type Yes  . Hernia, inguinal, right      -reviewed labs with pt -will refer to surgery for the hernia.  Discussed with pt that he should schedule end of October when he will have his insurance.  He knows to get help sooner (go to ER) if it becomes strangulated.  He is cautioned to avoid heavy lifting. -rx viagra.  Counseled pt on its proper use.  Discussed that he may not need it but he has it so he can worry less.  He is happy with plan -pt will be scheduled for follow up in 6 months.  He will cancel appointment if he gets his insurance and establish care with new provider.  Pt to RTO sooner prn

## 2018-01-10 ENCOUNTER — Ambulatory Visit: Payer: Self-pay | Admitting: Physician Assistant

## 2018-02-17 ENCOUNTER — Ambulatory Visit: Payer: Self-pay | Admitting: General Surgery

## 2018-03-01 ENCOUNTER — Encounter: Payer: Self-pay | Admitting: General Surgery

## 2018-03-01 ENCOUNTER — Ambulatory Visit (INDEPENDENT_AMBULATORY_CARE_PROVIDER_SITE_OTHER): Payer: BLUE CROSS/BLUE SHIELD | Admitting: General Surgery

## 2018-03-01 VITALS — BP 119/78 | HR 84 | Temp 98.4°F | Resp 18 | Wt 169.8 lb

## 2018-03-01 DIAGNOSIS — K4090 Unilateral inguinal hernia, without obstruction or gangrene, not specified as recurrent: Secondary | ICD-10-CM

## 2018-03-01 NOTE — Patient Instructions (Signed)

## 2018-03-02 NOTE — Progress Notes (Signed)
Justin Washington; [id number]; 06/13/1955   HPI Patient is a 62 year old white male who was referred to my care by Dr. Karilyn CotaGosrani for evaluation treatment of a left anal hernia.  Patient states he has had it for some time now, but is increasing in size and causing him discomfort.  Is made worse with straining.  It is starting to affect his daily lifestyle.  It does reduce on its own when he lies down.  No nausea or vomiting have been noted.  He currently has a pain level 3 out of 10. Past Medical History:  Diagnosis Date  . Chest pain   . Psoriasis   . Substance abuse [facility](HCC)     Past Surgical History:  Procedure Laterality Date  . HERNIA REPAIR Left mid 1990's    Family History  Problem Relation Age of Onset  . Cancer Mother 5070       esophogeal cancer  . Hypertension Father   . Intracerebral hemorrhage Father   . Cancer Brother [id number]       liver cancer  . Diabetes Brother     Current Outpatient Medications on File Prior to Visit  Medication Sig Dispense Refill  . ibuprofen (ADVIL,MOTRIN) 200 MG tablet Take 400 mg by mouth every 6 (six) hours as needed.     No current facility-administered medications on file prior to visit.     Allergies  Allergen Reactions  . 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 Packs/day: 1.00  . Years: 41.00  . Pack years: 41.00  . Types: Cigarettes  Smokeless Tobacco Never Used  Tobacco Comment   is trying vapes     Review of Systems  Constitutional: Negative.   HENT: Positive for sinus pain.   Eyes: Negative.   Respiratory: Negative.   Cardiovascular: Negative.   Gastrointestinal: Negative.   Genitourinary: Negative.   Musculoskeletal: Positive for back pain and joint pain.  Skin:  Negative.   Neurological: Negative.   Psychiatric/Behavioral: Negative.     Objective   Vitals:   03/01/18 1300  BP: 119/78  Pulse: 84  Resp: 18  Temp: 98.4 F (36.9 C)    Physical Exam  Constitutional: He is oriented to person, place, and time. He appears well-developed and well-nourished. No distress.  Cardiovascular: Normal rate, regular rhythm and normal heart sounds. Exam reveals no gallop and no friction rub.  No murmur heard. Pulmonary/Chest: Effort normal and breath sounds normal. No stridor. No respiratory distress. He has no wheezes. He has no rales.  Abdominal: Soft. Bowel sounds are normal. He exhibits no distension and no mass. There is no tenderness. There is no rebound and no guarding. A hernia is present.  Reducible left inguinal hernia  Genitourinary:  Genitourinary Comments: Genitourinary examination within normal limits.  Neurological: He is alert and oriented to person, place, and time.  Skin: Skin is warm and dry.  Vitals reviewed. Dr. Patty SermonsGosrani's notes reviewed  Assessment  Left inguinal hernia Plan   Patient is scheduled for left inguinal herniorrhaphy with mesh on 03/11/2018.  The risks and benefits of the procedure including bleeding, infection, mesh use, and the possibility of recurrence of the hernia were fully explained to the patient, who  gave informed consent.

## 2018-03-02 NOTE — H&P (Addendum)
Justin Washington; [id number]; 1956/01/21   HPI Patient is a 62 year old white male who was referred to my care by Dr. Karilyn CotaGosrani for evaluation treatment of a right inguinal hernia.  Patient states he has had it for some time now, but is increasing in size and causing him discomfort.  Is made worse with straining.  It is starting to affect his daily lifestyle.  It does reduce on its own when he lies down.  No nausea or vomiting have been noted.  He currently has a pain level 3 out of 10. Past Medical History:  Diagnosis Date  . Chest pain   . Psoriasis   . Substance abuse [facility](HCC)     Past Surgical History:  Procedure Laterality Date  . HERNIA REPAIR Left mid 1990's    Family History  Problem Relation Age of Onset  . Cancer Mother 8070       esophogeal cancer  . Hypertension Father   . Intracerebral hemorrhage Father   . Cancer Brother [id number]       liver cancer  . Diabetes Brother     Current Outpatient Medications on File Prior to Visit  Medication Sig Dispense Refill  . ibuprofen (ADVIL,MOTRIN) 200 MG tablet Take 400 mg by mouth every 6 (six) hours as needed.     No current facility-administered medications on file prior to visit.     Allergies  Allergen Reactions  . 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 Packs/day: 1.00  . Years: 41.00  . Pack years: 41.00  . Types: Cigarettes  Smokeless Tobacco Never Used  Tobacco Comment   is trying vapes     Review of Systems  Constitutional: Negative.   HENT: Positive for sinus pain.   Eyes: Negative.   Respiratory: Negative.   Cardiovascular: Negative.   Gastrointestinal: Negative.   Genitourinary: Negative.   Musculoskeletal: Positive for back pain and joint pain.  Skin:  Negative.   Neurological: Negative.   Psychiatric/Behavioral: Negative.     Objective   Vitals:   03/01/18 1300  BP: 119/78  Pulse: 84  Resp: 18  Temp: 98.4 F (36.9 C)    Physical Exam  Constitutional: He is oriented to person, place, and time. He appears well-developed and well-nourished. No distress.  Cardiovascular: Normal rate, regular rhythm and normal heart sounds. Exam reveals no gallop and no friction rub.  No murmur heard. Pulmonary/Chest: Effort normal and breath sounds normal. No stridor. No respiratory distress. He has no wheezes. He has no rales.  Abdominal: Soft. Bowel sounds are normal. He exhibits no distension and no mass. There is no tenderness. There is no rebound and no guarding. A hernia is present.  Reducible right inguinal hernia  Genitourinary:  Genitourinary Comments: Genitourinary examination within normal limits.  Neurological: He is alert and oriented to person, place, and time.  Skin: Skin is warm and dry.  Vitals reviewed. Dr. Patty SermonsGosrani's notes reviewed  Assessment  Right inguinal hernia Plan   Patient is scheduled for right inguinal herniorrhaphy with mesh on 03/11/2018.  The risks and benefits of the procedure including bleeding, infection, mesh use, and the possibility of recurrence of the hernia were fully explained to the patient, who  gave informed consent.  Addendum: The patient is having a right inguinal herniorrhaphy with mesh.  This was confirmed with the patient and preop.

## 2018-03-02 NOTE — Patient Instructions (Signed)
Justin Washington  03/02/2018     @PREFPERIOPPHARMACY @   Your procedure is scheduled on  03/11/2018 .  Report to [name] at  740   A.M.  Call this number if you have problems the morning of surgery:  [phone]   Remember:  Do not eat or drink after midnight.                        Take these medicines the morning of surgery with A SIP OF WATER  neurontin    Do not wear jewelry, make-up or nail polish.  Do not wear lotions, powders, or perfumes, or deodorant.  Do not shave 48 hours prior to surgery.  Men may shave face and neck.  Do not bring valuables to the hospital.  [facility] is not responsible for any belongings or valuables.  Contacts, dentures or bridgework may not be worn into surgery.  Leave your suitcase in the car.  After surgery it may be brought to your room.  For patients admitted to the hospital, discharge time will be determined by your treatment team.  Patients discharged the day of surgery will not be allowed to drive home.   Name and phone number of your driver:   Family Special instructions:  None  Please read over the following fact sheets that you were given. Anesthesia Post-op Instructions and Care and Recovery After Surgery       Open Hernia Repair, Adult Open hernia repair is a surgical procedure to fix a hernia. A hernia occurs when an internal organ or tissue pushes out through a weak spot in the abdominal wall muscles. Hernias commonly occur in the groin and around the navel. Most hernias tend to get worse over time. Often, surgery is done to prevent the hernia from becoming bigger, uncomfortable, or an emergency. Emergency surgery may be needed if abdominal contents get stuck in the opening (incarcerated hernia) or the blood supply gets cut off (strangulated hernia). In an open repair, an incision is made in the abdomen to perform the surgery. Tell a health care provider about:  Any allergies you have.  All medicines  you are taking, including vitamins, herbs, eye drops, creams, and over-the-counter medicines.  Any problems you or family members have had with anesthetic medicines.  Any blood or bone disorders you have.  Any surgeries you have had.  Any medical conditions you have, including any recent cold or flu symptoms.  Whether you are pregnant or may be pregnant. What are the risks? Generally, this is a safe procedure. However, problems may occur, including:  Long-lasting (chronic) pain.  Bleeding.  Infection.  Damage to the testicle. This can cause shrinking or swelling.  Damage to the bladder, blood vessels, intestine, or nerves near the hernia.  Trouble passing urine.  Allergic reactions to medicines.  Return of the hernia.  What happens before the procedure? Staying hydrated Follow instructions from your health care provider about hydration, which may include:  Up to 2 hours before the procedure - you may continue to drink clear liquids, such as water, clear fruit juice, black coffee, and plain tea.  Eating and drinking restrictions Follow instructions from your health care provider about eating and drinking, which may include:  8 hours before the procedure - stop eating heavy meals or foods such as meat, fried foods, or fatty foods.  6 hours before the procedure - stop eating light  meals or foods, such as toast or cereal.  6 hours before the procedure - stop drinking milk or drinks that contain milk.  2 hours before the procedure - stop drinking clear liquids.  Medicines  Ask your health care provider about: ? Changing or stopping your regular medicines. This is especially important if you are taking diabetes medicines or blood thinners. ? Taking medicines such as aspirin and ibuprofen. These medicines can thin your blood. Do not take these medicines before your procedure if your health care provider instructs you not to.  You may be given antibiotic medicine to help  prevent infection. General instructions  You may have blood tests or imaging studies.  Ask your health care provider how your surgical site will be marked or identified.  If you smoke, do not smoke for at least 2 weeks before your procedure or for as long as told by your health care provider.  Let your health care provider know if you develop a cold or any infection before your surgery.  Plan to have someone take you home from the hospital or clinic.  If you will be going home right after the procedure, plan to have someone with you for 24 hours. What happens during the procedure?  To reduce your risk of infection: ? Your health care team will wash or sanitize their hands. ? Your skin will be washed with soap. ? Hair may be removed from the surgical area.  An IV tube will be inserted into one of your veins.  You will be given one or more of the following: ? A medicine to help you relax (sedative). ? A medicine to numb the area (local anesthetic). ? A medicine to make you fall asleep (general anesthetic).  Your surgeon will make an incision over the hernia.  The tissues of the hernia will be moved back into place.  The edges of the hernia may be stitched together.  The opening in the abdominal muscles will be closed with stitches (sutures). Or, your surgeon will place a mesh patch made of manmade (synthetic) material over the opening.  The incision will be closed.  A bandage (dressing) may be placed over the incision. The procedure may vary among health care providers and hospitals. What happens after the procedure?  Your blood pressure, heart rate, breathing rate, and blood oxygen level will be monitored until the medicines you were given have worn off.  You may be given medicine for pain.  Do not drive for 24 hours if you received a sedative. This information is not intended to replace advice given to you by your health care provider. Make sure you discuss any  questions you have with your health care provider. Document Released: 09/30/2000 Document Revised: 10/25/2015 Document Reviewed: 09/18/2015 Elsevier Interactive Patient Education  2018 [employer].  Open Hernia Repair, Adult, Care After These instructions give you information about caring for yourself after your procedure. Your doctor may also give you more specific instructions. If you have problems or questions, contact your doctor. Follow these instructions at home: Surgical cut (incision) care   Follow instructions from your doctor about how to take care of your surgical cut area. Make sure you: ? Wash your hands with soap and water before you change your bandage (dressing). If you cannot use soap and water, use hand sanitizer. ? Change your bandage as told by your doctor. ? Leave stitches (sutures), skin glue, or skin tape (adhesive) strips in place. They may need to stay in  place for 2 weeks or longer. If tape strips get loose and curl up, you may trim the loose edges. Do not remove tape strips completely unless your doctor says it is okay.  Check your surgical cut every day for signs of infection. Check for: ? More redness, swelling, or pain. ? More fluid or blood. ? Warmth. ? Pus or a bad smell. Activity  Do not drive or use heavy machinery while taking prescription pain medicine. Do not drive until your doctor says it is okay.  Until your doctor says it is okay: ? Do not lift anything that is heavier than 10 lb (4.5 kg). ? Do not play contact sports.  Return to your normal activities as told by your doctor. Ask your doctor what activities are safe. General instructions  To prevent or treat having a hard time pooping (constipation) while you are taking prescription pain medicine, your doctor may recommend that you: ? Drink enough fluid to keep your pee (urine) clear or pale yellow. ? Take over-the-counter or prescription medicines. ? Eat foods that are high in fiber, such  as fresh fruits and vegetables, whole grains, and beans. ? Limit foods that are high in fat and processed sugars, such as fried and sweet foods.  Take over-the-counter and prescription medicines only as told by your doctor.  Do not take baths, swim, or use a hot tub until your doctor says it is okay.  Keep all follow-up visits as told by your doctor. This is important. Contact a doctor if:  You develop a rash.  You have more redness, swelling, or pain around your surgical cut.  You have more fluid or blood coming from your surgical cut.  Your surgical cut feels warm to the touch.  You have pus or a bad smell coming from your surgical cut.  You have a fever or chills.  You have blood in your poop (stool).  You have not pooped in 2-3 days.  Medicine does not help your pain. Get help right away if:  You have chest pain or you are short of breath.  You feel light-headed.  You feel weak and dizzy (feel faint).  You have very bad pain.  You throw up (vomit) and your pain is worse. This information is not intended to replace advice given to you by your health care provider. Make sure you discuss any questions you have with your health care provider. Document Released: 04/27/2014 Document Revised: 10/25/2015 Document Reviewed: 09/18/2015 Elsevier Interactive Patient Education  2018 [employer].  General Anesthesia, Adult General anesthesia is the use of medicines to make a person "go to sleep" (be unconscious) for a medical procedure. General anesthesia is often recommended when a procedure:  Is long.  Requires you to be still or in an unusual position.  Is major and can cause you to lose blood.  Is impossible to do without general anesthesia.  The medicines used for general anesthesia are called general anesthetics. In addition to making you sleep, the medicines:  Prevent pain.  Control your blood pressure.  Relax your muscles.  Tell a health care provider  about:  Any allergies you have.  All medicines you are taking, including vitamins, herbs, eye drops, creams, and over-the-counter medicines.  Any problems you or family members have had with anesthetic medicines.  Types of anesthetics you have had in the past.  Any bleeding disorders you have.  Any surgeries you have had.  Any medical conditions you have.  Any history of  heart or lung conditions, such as heart failure, sleep apnea, or chronic obstructive pulmonary disease (COPD).  Whether you are pregnant or may be pregnant.  Whether you use tobacco, alcohol, marijuana, or street drugs.  Any history of Financial planner.  Any history of depression or anxiety. What are the risks? Generally, this is a safe procedure. However, problems may occur, including:  Allergic reaction to anesthetics.  Lung and heart problems.  Inhaling food or liquids from your stomach into your lungs (aspiration).  Injury to nerves.  Waking up during your procedure and being unable to move (rare).  Extreme agitation or a state of mental confusion (delirium) when you wake up from the anesthetic.  Air in the bloodstream, which can lead to stroke.  These problems are more likely to develop if you are having a major surgery or if you have an advanced medical condition. You can prevent some of these complications by answering all of your health care provider's questions thoroughly and by following all pre-procedure instructions. General anesthesia can cause side effects, including:  Nausea or vomiting  A sore throat from the breathing tube.  Feeling cold or shivery.  Feeling tired, washed out, or achy.  Sleepiness or drowsiness.  Confusion or agitation.  What happens before the procedure? Staying hydrated Follow instructions from your health care provider about hydration, which may include:  Up to 2 hours before the procedure - you may continue to drink clear liquids, such as water, clear  fruit juice, black coffee, and plain tea.  Eating and drinking restrictions Follow instructions from your health care provider about eating and drinking, which may include:  8 hours before the procedure - stop eating heavy meals or foods such as meat, fried foods, or fatty foods.  6 hours before the procedure - stop eating light meals or foods, such as toast or cereal.  6 hours before the procedure - stop drinking milk or drinks that contain milk.  2 hours before the procedure - stop drinking clear liquids.  Medicines  Ask your health care provider about: ? Changing or stopping your regular medicines. This is especially important if you are taking diabetes medicines or blood thinners. ? Taking medicines such as aspirin and ibuprofen. These medicines can thin your blood. Do not take these medicines before your procedure if your health care provider instructs you not to. ? Taking new dietary supplements or medicines. Do not take these during the week before your procedure unless your health care provider approves them.  If you are told to take a medicine or to continue taking a medicine on the day of the procedure, take the medicine with sips of water. General instructions   Ask if you will be going home the same day, the following day, or after a longer hospital stay. ? Plan to have someone take you home. ? Plan to have someone stay with you for the first 24 hours after you leave the hospital or clinic.  For 3-6 weeks before the procedure, try not to use any tobacco products, such as cigarettes, chewing tobacco, and e-cigarettes.  You may brush your teeth on the morning of the procedure, but make sure to spit out the toothpaste. What happens during the procedure?  You will be given anesthetics through a mask and through an IV tube in one of your veins.  You may receive medicine to help you relax (sedative).  As soon as you are asleep, a breathing tube may be used to help you  breathe.  An anesthesia specialist will stay with you throughout the procedure. He or she will help keep you comfortable and safe by continuing to give you medicines and adjusting the amount of medicine that you get. He or she will also watch your blood pressure, pulse, and oxygen levels to make sure that the anesthetics do not cause any problems.  If a breathing tube was used to help you breathe, it will be removed before you wake up. The procedure may vary among health care providers and hospitals. What happens after the procedure?  You will wake up, often slowly, after the procedure is complete, usually in a recovery area.  Your blood pressure, heart rate, breathing rate, and blood oxygen level will be monitored until the medicines you were given have worn off.  You may be given medicine to help you calm down if you feel anxious or agitated.  If you will be going home the same day, your health care provider may check to make sure you can stand, drink, and urinate.  Your health care providers will treat your pain and side effects before you go home.  Do not drive for 24 hours if you received a sedative.  You may: ? Feel nauseous and vomit. ? Have a sore throat. ? Have mental slowness. ? Feel cold or shivery. ? Feel sleepy. ? Feel tired. ? Feel sore or achy, even in parts of your body where you did not have surgery. This information is not intended to replace advice given to you by your health care provider. Make sure you discuss any questions you have with your health care provider. Document Released: 07/14/2007 Document Revised: 09/17/2015 Document Reviewed: 03/21/2015 Elsevier Interactive Patient Education  2018 [employer] General Anesthesia, Adult, Care After These instructions provide you with information about caring for yourself after your procedure. Your health care provider may also give you more specific instructions. Your treatment has been planned according to current  medical practices, but problems sometimes occur. Call your health care provider if you have any problems or questions after your procedure. What can I expect after the procedure? After the procedure, it is common to have:  Vomiting.  A sore throat.  Mental slowness.  It is common to feel:  Nauseous.  Cold or shivery.  Sleepy.  Tired.  Sore or achy, even in parts of your body where you did not have surgery.  Follow these instructions at home: For at least 24 hours after the procedure:  Do not: ? Participate in activities where you could fall or become injured. ? Drive. ? Use heavy machinery. ? Drink alcohol. ? Take sleeping pills or medicines that cause drowsiness. ? Make important decisions or sign legal documents. ? Take care of children on your own.  Rest. Eating and drinking  If you vomit, drink water, juice, or soup when you can drink without vomiting.  Drink enough fluid to keep your urine clear or pale yellow.  Make sure you have little or no nausea before eating solid foods.  Follow the diet recommended by your health care provider. General instructions  Have a responsible adult stay with you until you are awake and alert.  Return to your normal activities as told by your health care provider. Ask your health care provider what activities are safe for you.  Take over-the-counter and prescription medicines only as told by your health care provider.  If you smoke, do not smoke without supervision.  Keep all follow-up visits as told by  your health care provider. This is important. Contact a health care provider if:  You continue to have nausea or vomiting at home, and medicines are not helpful.  You cannot drink fluids or start eating again.  You cannot urinate after 8-12 hours.  You develop a skin rash.  You have fever.  You have increasing redness at the site of your procedure. Get help right away if:  You have difficulty breathing.  You  have chest pain.  You have unexpected bleeding.  You feel that you are having a life-threatening or urgent problem. This information is not intended to replace advice given to you by your health care provider. Make sure you discuss any questions you have with your health care provider. Document Released: 07/13/2000 Document Revised: 09/09/2015 Document Reviewed: 03/21/2015 Elsevier Interactive Patient Education  [employer].

## 2018-03-07 ENCOUNTER — Encounter (HOSPITAL_COMMUNITY): Payer: Self-pay

## 2018-03-07 ENCOUNTER — Other Ambulatory Visit: Payer: Self-pay

## 2018-03-07 ENCOUNTER — Encounter (HOSPITAL_COMMUNITY)
Admission: RE | Admit: 2018-03-07 | Discharge: 2018-03-07 | Disposition: A | Discharge status: Home / Self Care | Payer: BLUE CROSS/BLUE SHIELD | Source: Ambulatory Visit | Attending: General Surgery | Admitting: General Surgery

## 2018-03-07 DIAGNOSIS — Z01818 Encounter for other preprocedural examination: Secondary | ICD-10-CM | POA: Diagnosis present

## 2018-03-07 DIAGNOSIS — K4090 Unilateral inguinal hernia, without obstruction or gangrene, not specified as recurrent: Secondary | ICD-10-CM | POA: Insufficient documentation

## 2018-03-07 HISTORY — DX: Major depressive disorder, single episode, unspecified: F32.9

## 2018-03-07 HISTORY — DX: Anxiety disorder, unspecified: F41.9

## 2018-03-07 HISTORY — DX: Depression, unspecified: F32.A

## 2018-03-07 LAB — CBC WITH DIFFERENTIAL/PLATELET
Abs Immature Granulocytes: 0.02 10*3/uL (ref 0.00–0.07)
BASOS ABS: 0.1 10*3/uL (ref 0.0–0.1)
Basophils Relative: 1 %
EOS PCT: 3 %
Eosinophils Absolute: 0.3 10*3/uL (ref 0.0–0.5)
HEMATOCRIT: 46.4 % (ref 39.0–52.0)
HEMOGLOBIN: 15 g/dL (ref 13.0–17.0)
IMMATURE GRANULOCYTES: 0 %
LYMPHS ABS: 2.2 10*3/uL (ref 0.7–4.0)
LYMPHS PCT: 23 %
MCH: 29.5 pg (ref 26.0–34.0)
MCHC: 32.3 g/dL (ref 30.0–36.0)
MCV: 91.3 fL (ref 80.0–100.0)
Monocytes Absolute: 0.5 10*3/uL (ref 0.1–1.0)
Monocytes Relative: 5 %
NEUTROS PCT: 68 %
NRBC: 0 % (ref 0.0–0.2)
Neutro Abs: 6.6 10*3/uL (ref 1.7–7.7)
Platelets: 391 10*3/uL (ref 150–400)
RBC: 5.08 MIL/uL (ref 4.22–5.81)
RDW: 14.6 % (ref 11.5–15.5)
WBC: 9.5 10*3/uL (ref 4.0–10.5)

## 2018-03-07 LAB — RAPID URINE DRUG SCREEN, HOSP PERFORMED
Amphetamines: NOT DETECTED
BARBITURATES: NOT DETECTED
BENZODIAZEPINES: NOT DETECTED
COCAINE: NOT DETECTED
Opiates: NOT DETECTED
TETRAHYDROCANNABINOL: NOT DETECTED

## 2018-03-07 LAB — BASIC METABOLIC PANEL
Anion gap: 9 (ref 5–15)
BUN: 11 mg/dL (ref 8–23)
CHLORIDE: 102 mmol/L (ref 98–111)
CO2: 26 mmol/L (ref 22–32)
CREATININE: 0.85 mg/dL (ref 0.61–1.24)
Calcium: 9 mg/dL (ref 8.9–10.3)
GFR calc non Af Amer: >60 mL/min (ref >60)
Glucose, Bld: 136 mg/dL — ABNORMAL HIGH (ref 70–99)
Potassium: 4.2 mmol/L (ref 3.5–5.1)
Sodium: 137 mmol/L (ref 135–145)

## 2018-03-11 ENCOUNTER — Ambulatory Visit (HOSPITAL_COMMUNITY): Payer: BLUE CROSS/BLUE SHIELD | Admitting: Anesthesiology

## 2018-03-11 ENCOUNTER — Encounter (HOSPITAL_COMMUNITY): Payer: Self-pay | Admitting: *Deleted

## 2018-03-11 ENCOUNTER — Ambulatory Visit (HOSPITAL_COMMUNITY)
Admission: RE | Admit: 2018-03-11 | Discharge: 2018-03-11 | Disposition: A | Discharge status: Home / Self Care | Payer: BLUE CROSS/BLUE SHIELD | Source: Ambulatory Visit | Attending: General Surgery | Admitting: General Surgery

## 2018-03-11 ENCOUNTER — Encounter (HOSPITAL_COMMUNITY)
Admission: RE | Disposition: A | Discharge status: Home / Self Care | Payer: Self-pay | Source: Ambulatory Visit | Attending: General Surgery

## 2018-03-11 DIAGNOSIS — F17210 Nicotine dependence, cigarettes, uncomplicated: Secondary | ICD-10-CM | POA: Diagnosis not present

## 2018-03-11 DIAGNOSIS — I2510 Atherosclerotic heart disease of native coronary artery without angina pectoris: Secondary | ICD-10-CM | POA: Insufficient documentation

## 2018-03-11 DIAGNOSIS — L409 Psoriasis, unspecified: Secondary | ICD-10-CM | POA: Insufficient documentation

## 2018-03-11 DIAGNOSIS — K4090 Unilateral inguinal hernia, without obstruction or gangrene, not specified as recurrent: Secondary | ICD-10-CM

## 2018-03-11 DIAGNOSIS — Z791 Long term (current) use of non-steroidal anti-inflammatories (NSAID): Secondary | ICD-10-CM | POA: Diagnosis not present

## 2018-03-11 HISTORY — PX: INGUINAL HERNIA REPAIR: SHX194

## 2018-03-11 SURGERY — REPAIR, HERNIA, INGUINAL, ADULT
Anesthesia: General | Site: Groin | Laterality: Right

## 2018-03-11 MED ORDER — KETOROLAC TROMETHAMINE 30 MG/ML IJ SOLN
INTRAMUSCULAR | Status: DC | PRN
  Administered 2018-03-11: 30 mg via INTRAVENOUS

## 2018-03-11 MED ORDER — ONDANSETRON HCL 4 MG/2ML IJ SOLN
INTRAMUSCULAR | Status: AC
  Filled 2018-03-11: qty 2

## 2018-03-11 MED ORDER — 0.9 % SODIUM CHLORIDE (POUR BTL) OPTIME
TOPICAL | Status: DC | PRN
  Administered 2018-03-11: 1000 mL

## 2018-03-11 MED ORDER — PHENYLEPHRINE HCL 10 MG/ML IJ SOLN
INTRAMUSCULAR | Status: DC | PRN
  Administered 2018-03-11: 80 ug via INTRAVENOUS

## 2018-03-11 MED ORDER — LIDOCAINE 2% (20 MG/ML) 5 ML SYRINGE
INTRAMUSCULAR | Status: DC | PRN
  Administered 2018-03-11: 40 mg via INTRAVENOUS

## 2018-03-11 MED ORDER — CHLORHEXIDINE GLUCONATE CLOTH 2 % EX PADS: 6.0000 | MEDICATED_PAD | Freq: Once | CUTANEOUS | Status: DC

## 2018-03-11 MED ORDER — GLYCOPYRROLATE PF 0.2 MG/ML IJ SOSY
PREFILLED_SYRINGE | INTRAMUSCULAR | Status: DC | PRN
  Administered 2018-03-11: .2 mg via INTRAVENOUS

## 2018-03-11 MED ORDER — HYDROMORPHONE HCL 1 MG/ML IJ SOLN
0.2500 mg | INTRAMUSCULAR | Status: DC | PRN
  Administered 2018-03-11 (×2): 0.25 mg via INTRAVENOUS
  Filled 2018-03-11: qty 0.5

## 2018-03-11 MED ORDER — ONDANSETRON HCL 4 MG/2ML IJ SOLN
INTRAMUSCULAR | Status: DC | PRN
  Administered 2018-03-11: 4 mg via INTRAVENOUS

## 2018-03-11 MED ORDER — CEFAZOLIN SODIUM-DEXTROSE 2-4 GM/100ML-% IV SOLN
2.0000 g | INTRAVENOUS | Status: AC
  Administered 2018-03-11: 2 g via INTRAVENOUS
  Filled 2018-03-11: qty 100

## 2018-03-11 MED ORDER — LACTATED RINGERS IV SOLN
INTRAVENOUS | Status: DC
  Administered 2018-03-11: 1000 mL via INTRAVENOUS

## 2018-03-11 MED ORDER — PROMETHAZINE HCL 25 MG/ML IJ SOLN: 6.2500 mg | INTRAMUSCULAR | Status: DC | PRN

## 2018-03-11 MED ORDER — FENTANYL CITRATE (PF) 100 MCG/2ML IJ SOLN
INTRAMUSCULAR | Status: DC | PRN
  Administered 2018-03-11: 25 ug via INTRAVENOUS
  Administered 2018-03-11: 50 ug via INTRAVENOUS
  Administered 2018-03-11: 25 ug via INTRAVENOUS

## 2018-03-11 MED ORDER — HYDROCODONE-ACETAMINOPHEN 7.5-325 MG PO TABS
1.0000 | ORAL_TABLET | Freq: Once | ORAL | Status: AC | PRN
  Administered 2018-03-11: 1 via ORAL
  Filled 2018-03-11: qty 1

## 2018-03-11 MED ORDER — MIDAZOLAM HCL 2 MG/2ML IJ SOLN: 0.5000 mg | Freq: Once | INTRAMUSCULAR | Status: DC | PRN

## 2018-03-11 MED ORDER — FENTANYL CITRATE (PF) 100 MCG/2ML IJ SOLN
INTRAMUSCULAR | Status: AC
  Filled 2018-03-11: qty 2

## 2018-03-11 MED ORDER — BUPIVACAINE LIPOSOME 1.3 % IJ SUSP
INTRAMUSCULAR | Status: AC
  Filled 2018-03-11: qty 20

## 2018-03-11 MED ORDER — BUPIVACAINE LIPOSOME 1.3 % IJ SUSP
INTRAMUSCULAR | Status: DC | PRN
  Administered 2018-03-11: 20 mL

## 2018-03-11 MED ORDER — PROPOFOL 10 MG/ML IV BOLUS
INTRAVENOUS | Status: DC | PRN
  Administered 2018-03-11: 180 mg via INTRAVENOUS
  Administered 2018-03-11: 50 mg via INTRAVENOUS

## 2018-03-11 MED ORDER — PROPOFOL 10 MG/ML IV BOLUS
INTRAVENOUS | Status: AC
  Filled 2018-03-11: qty 40

## 2018-03-11 MED ORDER — PHENYLEPHRINE 40 MCG/ML (10ML) SYRINGE FOR IV PUSH (FOR BLOOD PRESSURE SUPPORT)
PREFILLED_SYRINGE | INTRAVENOUS | Status: AC
  Filled 2018-03-11: qty 10

## 2018-03-11 SURGICAL SUPPLY — 39 items
ADH SKN CLS APL DERMABOND .7 (GAUZE/BANDAGES/DRESSINGS) ×1
CLOTH BEACON ORANGE TIMEOUT ST (SAFETY) ×2 IMPLANT
COVER LIGHT HANDLE STERIS (MISCELLANEOUS) ×4 IMPLANT
COVER WAND RF STERILE (DRAPES) ×1 IMPLANT
DERMABOND ADVANCED (GAUZE/BANDAGES/DRESSINGS) ×1
DERMABOND ADVANCED .7 DNX12 (GAUZE/BANDAGES/DRESSINGS) ×1 IMPLANT
DRAIN PENROSE 18X1/2 LTX STRL (DRAIN) ×2 IMPLANT
ELECT REM PT RETURN 9FT ADLT (ELECTROSURGICAL) ×2
ELECTRODE REM PT RTRN 9FT ADLT (ELECTROSURGICAL) ×1 IMPLANT
GAUZE SPONGE 4X4 12PLY STRL (GAUZE/BANDAGES/DRESSINGS) ×2 IMPLANT
GLOVE BIO SURGEON STRL SZ7 (GLOVE) ×2 IMPLANT
GLOVE BIOGEL PI IND STRL 7.0 (GLOVE) ×3 IMPLANT
GLOVE BIOGEL PI INDICATOR 7.0 (GLOVE) ×3
GLOVE SURG SS PI 7.5 STRL IVOR (GLOVE) ×2 IMPLANT
GOWN STRL REUS W/ TWL XL LVL3 (GOWN DISPOSABLE) ×1 IMPLANT
GOWN STRL REUS W/TWL LRG LVL3 (GOWN DISPOSABLE) ×4 IMPLANT
GOWN STRL REUS W/TWL XL LVL3 (GOWN DISPOSABLE) ×2
INST SET MINOR GENERAL (KITS) ×2 IMPLANT
KIT TURNOVER KIT A (KITS) ×2 IMPLANT
MANIFOLD NEPTUNE II (INSTRUMENTS) ×2 IMPLANT
MESH MARLEX PLUG MEDIUM (Mesh General) ×1 IMPLANT
NDL HYPO 21X1.5 SAFETY (NEEDLE) ×1 IMPLANT
NEEDLE HYPO 21X1.5 SAFETY (NEEDLE) ×2 IMPLANT
NS IRRIG 1000ML POUR BTL (IV SOLUTION) ×2 IMPLANT
PACK MINOR (CUSTOM PROCEDURE TRAY) ×2 IMPLANT
PAD ARMBOARD 7.5X6 YLW CONV (MISCELLANEOUS) ×2 IMPLANT
SET BASIN LINEN APH (SET/KITS/TRAYS/PACK) ×2 IMPLANT
SOL PREP PROV IODINE SCRUB 4OZ (MISCELLANEOUS) ×2 IMPLANT
SUT MNCRL AB 4-0 PS2 18 (SUTURE) ×2 IMPLANT
SUT NOVA NAB GS-22 2 2-0 T-19 (SUTURE) ×5 IMPLANT
SUT PROLENE 2 0 SH 30 (SUTURE) IMPLANT
SUT SILK 3 0 (SUTURE)
SUT SILK 3-0 18XBRD TIE 12 (SUTURE) IMPLANT
SUT VIC AB 2-0 CT1 27 (SUTURE) ×2
SUT VIC AB 2-0 CT1 TAPERPNT 27 (SUTURE) ×1 IMPLANT
SUT VIC AB 3-0 SH 27 (SUTURE) ×2
SUT VIC AB 3-0 SH 27X BRD (SUTURE) ×1 IMPLANT
SUT VICRYL AB 3 0 TIES (SUTURE) IMPLANT
SYR 20CC LL (SYRINGE) ×2 IMPLANT

## 2018-03-11 NOTE — Op Note (Signed)
Patient:  Justin Washington  DOB:  1955-10-10  MRN:  [id number]   Preop Diagnosis: Right inguinal hernia  Postop Diagnosis: Same  Procedure: Right inguinal herniorrhaphy with mesh  Surgeon: Franky MachoMark Maikayla Beggs, MD  Anes: General  Indications: Patient is a 62 year old white male who presents with a right inguinal hernia.  The risks and benefits of the procedure including bleeding, infection, mesh use, and the possibility of recurrence of the hernia were fully explained to the patient, who gave informed consent.  Procedure note: The patient was placed in supine position.  After general anesthesia was administered, the right groin region was prepped and draped using the usual sterile technique with Betadine.  Surgical site confirmation was performed.  An incision was made in the right groin region down to the external oblique aponeurosis.  The aponeurosis was incised to the external ring.  A Penrose drain was placed around the spermatic cord.  The vas deferens was noted within the spermatic cord.  The ileoinguinal nerve was identified and retracted superiorly from the operative field.  An indirect hernia sac was found.  This was freed away from the spermatic cord up to the peritoneal reflection and inverted.  A medium sized Bard PerFix plug was then inserted.  An onlay patch was placed along the floor of the inguinal canal and secured superiorly to the conjoined tendon and inferiorly to the shelving edge of Poupart's ligament using a 2-0 Novafil interrupted suture.  The internal ring was re-created using a 2-0 Novafil interrupted suture.  The external oblique aponeurosis was reapproximated using a 2-0 Vicryl running suture.  The subcutaneous layer was reapproximated using a 3-0 Vicryl interrupted suture.  Exparel was instilled into the surrounding wound.  The skin was closed using a 4-0 Monocryl subcuticular suture.  Dermabond was applied.  All tape and needle counts were correct at the end of the  procedure.  The patient was awakened and transferred to PACU in stable condition.  Complications: None  EBL: Minimal  Specimen: None

## 2018-03-11 NOTE — Transfer of Care (Signed)
Immediate Anesthesia Transfer of Care Note  Patient: Justin Washington  Procedure(s) Performed: HERNIA REPAIR INGUINAL ADULT WITH MESH (Right Groin)  Patient Location: PACU  Anesthesia Type:General  Level of Consciousness: awake, alert , oriented and patient cooperative  Airway & Oxygen Therapy: Patient Spontanous Breathing  Post-op Assessment: Report given to RN and Post -op Vital signs reviewed and stable  Post vital signs: Reviewed and stable  Last Vitals:  Vitals Value Taken Time  BP 143/97 03/11/2018  9:15 AM  Temp    Pulse 87 03/11/2018  9:18 AM  Resp 12 03/11/2018  9:18 AM  SpO2 96 % 03/11/2018  9:18 AM  Vitals shown include unvalidated device data.  Last Pain:  Vitals:   03/11/18 0752  TempSrc: Oral  PainSc: 0-No pain      Patients Stated Pain Goal: 6 (03/11/18 0752)  Complications: No apparent anesthesia complications

## 2018-03-11 NOTE — Anesthesia Procedure Notes (Signed)
Procedure Name: LMA Insertion Date/Time: 03/11/2018 8:28 AM Performed by: Adams, Amy A, CRNA Pre-anesthesia Checklist: Patient identified, Timeout performed, Emergency Drugs available, Suction available and Patient being monitored Patient Re-evaluated:Patient Re-evaluated prior to induction Oxygen Delivery Method: Circle system utilized Preoxygenation: Pre-oxygenation with 100% oxygen LMA: LMA inserted LMA Size: 5.0 Number of attempts: 1 Placement Confirmation: positive ETCO2 and breath sounds checked- equal and bilateral Tube secured with: Tape

## 2018-03-11 NOTE — Anesthesia Preprocedure Evaluation (Addendum)
Anesthesia Evaluation  Patient identified by MRN, date of birth, ID band Patient awake    Reviewed: Allergy & Precautions, NPO status , Patient's Chart, lab work & pertinent test results  Airway Mallampati: II  TM Distance: >3 FB Neck ROM: Full    Dental no notable dental hx. (+) Teeth Intact   Pulmonary neg pulmonary ROS, Current Smoker,    Pulmonary exam normal breath sounds clear to auscultation       Cardiovascular Exercise Tolerance: Good + CAD  Normal cardiovascular examI Rhythm:Regular Rate:Normal     Neuro/Psych Anxiety Depression negative neurological ROS  negative psych ROS   GI/Hepatic negative GI ROS, Neg liver ROS,   Endo/Other  negative endocrine ROS  Renal/GU negative Renal ROS  negative genitourinary   Musculoskeletal negative musculoskeletal ROS (+)   Abdominal   Peds negative pediatric ROS (+)  Hematology negative hematology ROS (+)   Anesthesia Other Findings   Reproductive/Obstetrics negative OB ROS                             Anesthesia Physical Anesthesia Plan  ASA: II  Anesthesia Plan: General   Post-op Pain Management:    Induction: Intravenous  PONV Risk Score and Plan:   Airway Management Planned: LMA  Additional Equipment:   Intra-op Plan:   Post-operative Plan:   Informed Consent: I have reviewed the patients History and Physical, chart, labs and discussed the procedure including the risks, benefits and alternatives for the proposed anesthesia with the patient or authorized representative who has indicated his/her understanding and acceptance.   Dental advisory given  Plan Discussed with: CRNA  Anesthesia Plan Comments: (LMA vs ETT as needed )       Anesthesia Quick Evaluation

## 2018-03-11 NOTE — Interval H&P Note (Signed)
History and Physical Interval Note:  03/11/2018 8:08 AM  Justin Washington  has presented today for surgery, with the diagnosis of right inguinal hernia  The various methods of treatment have been discussed with the patient and family. After consideration of risks, benefits and other options for treatment, the patient has consented to  Procedure(s): HERNIA REPAIR INGUINAL ADULT WITH MESH (Right) as a surgical intervention .  The patient's history has been reviewed, patient examined, no change in status, stable for surgery.  I have reviewed the patient's chart and labs.  Questions were answered to the patient's satisfaction.     Franky MachoMark Anetria Harwick

## 2018-03-11 NOTE — Anesthesia Postprocedure Evaluation (Signed)
Anesthesia Post Note  Patient: Justin Washington R Gallego  Procedure(s) Performed: HERNIA REPAIR INGUINAL ADULT WITH MESH (Right Groin)  Patient location during evaluation: PACU Anesthesia Type: General Level of consciousness: awake and alert and oriented Pain management: pain level controlled Vital Signs Assessment: post-procedure vital signs reviewed and stable Respiratory status: spontaneous breathing Cardiovascular status: stable Postop Assessment: no apparent nausea or vomiting Anesthetic complications: no     Last Vitals:  Vitals:   03/11/18 0915 03/11/18 0930  BP: (!) 143/97 (!) 155/82  Pulse: 85 81  Resp: 18 12  Temp: 36.6 C   SpO2: 96% 94%    Last Pain:  Vitals:   03/11/18 0927  TempSrc:   PainSc: 9                  ADAMS, AMY A

## 2018-03-11 NOTE — Discharge Instructions (Signed)
Open Hernia Repair, Adult, Care After °This sheet gives you information about how to care for yourself after your procedure. Your health care provider may also give you more specific instructions. If you have problems or questions, contact your health care provider. °What can I expect after the procedure? °After the procedure, it is common to have: °· Mild discomfort. °· Slight bruising. °· Minor swelling. °· Pain in the abdomen. ° °Follow these instructions at home: °Incision care ° °· Follow instructions from your health care provider about how to take care of your incision area. Make sure you: °? Wash your hands with soap and water before you change your bandage (dressing). If soap and water are not available, use hand sanitizer. °? Change your dressing as told by your health care provider. °? Leave stitches (sutures), skin glue, or adhesive strips in place. These skin closures may need to stay in place for 2 weeks or longer. If adhesive strip edges start to loosen and curl up, you may trim the loose edges. Do not remove adhesive strips completely unless your health care provider tells you to do that. °· Check your incision area every day for signs of infection. Check for: °? More redness, swelling, or pain. °? More fluid or blood. °? Warmth. °? Pus or a bad smell. °Activity °· Do not drive or use heavy machinery while taking prescription pain medicine. Do not drive until your health care provider approves. °· Until your health care provider approves: °? Do not lift anything that is heavier than 10 lb (4.5 kg). °? Do not play contact sports. °· Return to your normal activities as told by your health care provider. Ask your health care provider what activities are safe. °General instructions °· To prevent or treat constipation while you are taking prescription pain medicine, your health care provider may recommend that you: °? Drink enough fluid to keep your urine clear or pale yellow. °? Take over-the-counter or  prescription medicines. °? Eat foods that are high in fiber, such as fresh fruits and vegetables, whole grains, and beans. °? Limit foods that are high in fat and processed sugars, such as fried and sweet foods. °· Take over-the-counter and prescription medicines only as told by your health care provider. °· Do not take tub baths or go swimming until your health care provider approves. °· Keep all follow-up visits as told by your health care provider. This is important. °Contact a health care provider if: °· You develop a rash. °· You have more redness, swelling, or pain around your incision. °· You have more fluid or blood coming from your incision. °· Your incision feels warm to the touch. °· You have pus or a bad smell coming from your incision. °· You have a fever or chills. °· You have blood in your stool (feces). °· You have not had a bowel movement in 2-3 days. °· Your pain is not controlled with medicine. °Get help right away if: °· You have chest pain or shortness of breath. °· You feel light-headed or feel faint. °· You have severe pain. °· You vomit and your pain is worse. °This information is not intended to replace advice given to you by your health care provider. Make sure you discuss any questions you have with your health care provider. °Document Released: 10/24/2004 Document Revised: 10/25/2015 Document Reviewed: 09/18/2015 °Elsevier Interactive Patient Education © 2018 [employer] ° °

## 2018-03-14 ENCOUNTER — Encounter (HOSPITAL_COMMUNITY): Payer: Self-pay | Admitting: General Surgery

## 2018-03-24 ENCOUNTER — Ambulatory Visit (INDEPENDENT_AMBULATORY_CARE_PROVIDER_SITE_OTHER): Payer: Self-pay | Admitting: General Surgery

## 2018-03-24 ENCOUNTER — Encounter: Payer: Self-pay | Admitting: General Surgery

## 2018-03-24 VITALS — BP 145/75 | HR 76 | Temp 97.8°F | Resp 20 | Wt 170.8 lb

## 2018-03-24 DIAGNOSIS — Z09 Encounter for follow-up examination after completed treatment for conditions other than malignant neoplasm: Secondary | ICD-10-CM

## 2018-03-24 NOTE — Progress Notes (Signed)
Subjective:     Justin Washington  Patient here for postoperative visit.  Patient doing well.  Denies any significant incisional pain. Objective:    BP (!) 145/75 (BP Location: Left Arm, Patient Position: Sitting, Cuff Size: Normal)   Pulse 76   Temp 97.8 F (36.6 C) (Temporal)   Resp 20   Wt 170 lb 12.8 oz (77.5 kg)   BMI 25.97 kg/m   General:  alert, cooperative and no distress  Right groin incision healing well.  No ecchymosis present.     Assessment:    Doing well postoperatively.    Plan:   Increase activity as able.  Follow-up here as needed.

## 2018-05-18 NOTE — Congregational Nurse Program (Unsigned)
  Dept: [phone]   Congregational Nurse Program Note  Date of Encounter: 05/18/2018  Past Medical History: Past Medical History:  Diagnosis Date  . Anxiety   . Chest pain   . Depression   . Psoriasis   . Substance abuse [facility])     Encounter Details: CNP Questionnaire - 04/21/18 1245      Questionnaire   Patient Status  Not Applicable    Race  White or Caucasian    Location Patient Served At  [employer]    Uninsured  Not Applicable    Food  Yes, have food insecurities    Housing/Utilities  Yes, have permanent housing    Transportation  No transportation needs    Medication  No medication insecurities    Medical Provider  Yes    Referrals  Not Applicable    ED Visit Averted  Not Applicable    Life-Saving Intervention Made  Not Applicable     04-21-18 Want Blood pressure checked.  B/ P 120/85 Pulse 75.Getting food from [facility] .  Cecilie Kicks, RN [phone]

## 2018-06-27 ENCOUNTER — Other Ambulatory Visit: Payer: Self-pay | Admitting: Physician Assistant

## 2018-06-27 DIAGNOSIS — E785 Hyperlipidemia, unspecified: Secondary | ICD-10-CM

## 2018-06-28 ENCOUNTER — Ambulatory Visit: Payer: Self-pay | Admitting: Physician Assistant

## 2018-06-28 ENCOUNTER — Other Ambulatory Visit: Payer: Self-pay

## 2018-06-28 ENCOUNTER — Encounter: Payer: Self-pay | Admitting: Physician Assistant

## 2018-06-28 ENCOUNTER — Other Ambulatory Visit (HOSPITAL_COMMUNITY)
Admission: RE | Admit: 2018-06-28 | Discharge: 2018-06-28 | Disposition: A | Discharge status: Home / Self Care | Payer: Self-pay | Source: Ambulatory Visit | Attending: Physician Assistant | Admitting: Physician Assistant

## 2018-06-28 VITALS — BP 125/80 | HR 72 | Temp 98.1°F | Wt 181.5 lb

## 2018-06-28 DIAGNOSIS — K219 Gastro-esophageal reflux disease without esophagitis: Secondary | ICD-10-CM

## 2018-06-28 DIAGNOSIS — E785 Hyperlipidemia, unspecified: Secondary | ICD-10-CM

## 2018-06-28 DIAGNOSIS — R0989 Other specified symptoms and signs involving the circulatory and respiratory systems: Secondary | ICD-10-CM

## 2018-06-28 LAB — COMPREHENSIVE METABOLIC PANEL
ALBUMIN: 4 g/dL (ref 3.5–5.0)
ALK PHOS: 46 U/L (ref 38–126)
ALT: 13 U/L (ref 0–44)
AST: 16 U/L (ref 15–41)
Anion gap: 6 (ref 5–15)
BILIRUBIN TOTAL: 0.3 mg/dL (ref 0.3–1.2)
BUN: 15 mg/dL (ref 8–23)
CALCIUM: 8.9 mg/dL (ref 8.9–10.3)
CO2: 24 mmol/L (ref 22–32)
CREATININE: 0.88 mg/dL (ref 0.61–1.24)
Chloride: 106 mmol/L (ref 98–111)
GFR calc non Af Amer: >60 mL/min (ref >60)
GLUCOSE: 100 mg/dL — AB (ref 70–99)
Potassium: 4.3 mmol/L (ref 3.5–5.1)
Sodium: 136 mmol/L (ref 135–145)
TOTAL PROTEIN: 7 g/dL (ref 6.5–8.1)

## 2018-06-28 LAB — LIPID PANEL
CHOL/HDL RATIO: 3.8 ratio
CHOLESTEROL: 196 mg/dL (ref 0–200)
HDL: 51 mg/dL (ref >40)
LDL Cholesterol: 134 mg/dL — ABNORMAL HIGH (ref 0–99)
Triglycerides: 54 mg/dL (ref <150)
VLDL: 11 mg/dL (ref 0–40)

## 2018-06-28 MED ORDER — OMEPRAZOLE 40 MG PO CPDR: 40.0000 mg | DELAYED_RELEASE_CAPSULE | Freq: Every day | ORAL | 3 refills | Status: DC

## 2018-06-28 MED ORDER — SIMVASTATIN 10 MG PO TABS: 10.0000 mg | ORAL_TABLET | Freq: Every day | ORAL | 4 refills | Status: DC

## 2018-06-28 NOTE — Progress Notes (Signed)
BP 125/80 (BP Location: Right Arm, Patient Position: Sitting, Cuff Size: Normal)   Pulse 72   Temp 98.1 F (36.7 C)   Wt 181 lb 8 oz (82.3 kg)   SpO2 94%   BMI 27.60 kg/m    Subjective:    Patient ID: Justin Washington, male    DOB: [birth year], 63 y.o.   MRN: [id number]  HPI: Justin Washington is a 63 y.o. male presenting on 06/28/2018 for Hyperlipidemia   HPI   Pt had insurance for a while which is why he hasn't been in our office since september.  He had his hernia sergery.  He requests prilosec.  He says he sometimes has feeling that something is stuck in his throat.  He is not having any dysphagia.  He is otherwise doing well.   He went to dr Lelan Pons one time but then didn't return.   Pt ran out of his simvastatin months ago.    Relevant past medical, surgical, family and social history reviewed and updated as indicated. Interim medical history since our last visit reviewed. Allergies and medications reviewed and updated.  No current outpatient medications on file.   Review of Systems  Constitutional: Negative for appetite change, chills, diaphoresis, fatigue, fever and unexpected weight change.  HENT: Negative for congestion, dental problem, drooling, ear pain, facial swelling, hearing loss, mouth sores, sneezing, sore throat, trouble swallowing and voice change.   Eyes: Negative for pain, discharge, redness, itching and visual disturbance.  Respiratory: Negative for cough, choking, shortness of breath and wheezing.   Cardiovascular: Negative for chest pain, palpitations and leg swelling.  Gastrointestinal: Negative for abdominal pain, blood in stool, constipation, diarrhea and vomiting.  Endocrine: Negative for cold intolerance, heat intolerance and polydipsia.  Genitourinary: Negative for decreased urine volume, dysuria and hematuria.  Musculoskeletal: Negative for arthralgias, back pain and gait problem.  Skin: Negative for rash.  Allergic/Immunologic: Negative for  environmental allergies.  Neurological: Negative for seizures, syncope, light-headedness and headaches.  Hematological: Negative for adenopathy.  Psychiatric/Behavioral: Negative for agitation, dysphoric mood and suicidal ideas. The patient is not nervous/anxious.     Per HPI unless specifically indicated above     Objective:    BP 125/80 (BP Location: Right Arm, Patient Position: Sitting, Cuff Size: Normal)   Pulse 72   Temp 98.1 F (36.7 C)   Wt 181 lb 8 oz (82.3 kg)   SpO2 94%   BMI 27.60 kg/m   Wt Readings from Last 3 Encounters:  06/28/18 181 lb 8 oz (82.3 kg)  03/24/18 170 lb 12.8 oz (77.5 kg)  03/07/18 170 lb (77.1 kg)    Physical Exam Vitals signs reviewed.  Constitutional:      Appearance: He is well-developed.  HENT:     Head: Normocephalic and atraumatic.  Neck:     Musculoskeletal: Neck supple.  Cardiovascular:     Rate and Rhythm: Normal rate and regular rhythm.  Pulmonary:     Effort: Pulmonary effort is normal.     Breath sounds: Normal breath sounds. No wheezing.  Abdominal:     General: Bowel sounds are normal.     Palpations: Abdomen is soft.     Tenderness: There is no abdominal tenderness.  Lymphadenopathy:     Cervical: No cervical adenopathy.  Skin:    General: Skin is warm and dry.  Neurological:     Mental Status: He is alert and oriented to person, place, and time.  Psychiatric:  Behavior: Behavior normal.     Results for orders placed or performed during the hospital encounter of 06/28/18  Lipid panel  Result Value Ref Range   Cholesterol 196 0 - 200 mg/dL   Triglycerides 54 <814 mg/dL   HDL 51 >48 mg/dL   Total CHOL/HDL Ratio 3.8 RATIO   VLDL 11 0 - 40 mg/dL   LDL Cholesterol 185 (H) 0 - 99 mg/dL  Comprehensive metabolic panel  Result Value Ref Range   Sodium 136 135 - 145 mmol/L   Potassium 4.3 3.5 - 5.1 mmol/L   Chloride 106 98 - 111 mmol/L   CO2 24 22 - 32 mmol/L   Glucose, Bld 100 (H) 70 - 99 mg/dL   BUN 15 8 -  23 mg/dL   Creatinine, Ser 6.31 0.61 - 1.24 mg/dL   Calcium 8.9 8.9 - 49.7 mg/dL   Total Protein 7.0 6.5 - 8.1 g/dL   Albumin 4.0 3.5 - 5.0 g/dL   AST 16 15 - 41 U/L   ALT 13 0 - 44 U/L   Alkaline Phosphatase 46 38 - 126 U/L   Total Bilirubin 0.3 0.3 - 1.2 mg/dL   GFR calc non Af Amer >60 >60 mL/min   GFR calc Af Amer >60 >60 mL/min   Anion gap 6 5 - 15   EKG - sinus rhythm at 68 bpm.  No st-t changes.  No changes with EKG done November 2019     Assessment & Plan:   Encounter Diagnoses  Name Primary?  . Hyperlipidemia, unspecified hyperlipidemia type Yes  . Globus sensation   . Gastroesophageal reflux disease, esophagitis presence not specified      -reviewed labs with pt -pt to get back on simvastatin and watch low-fat diet -rx omeprazole -pt to follow up 1 month to recheck globus sensation.  Pt to RTO sooner prn worsening or new symptoms

## 2018-08-01 ENCOUNTER — Ambulatory Visit: Payer: Self-pay | Admitting: Physician Assistant

## 2018-08-01 ENCOUNTER — Encounter: Payer: Self-pay | Admitting: Physician Assistant

## 2018-08-01 DIAGNOSIS — F17200 Nicotine dependence, unspecified, uncomplicated: Secondary | ICD-10-CM

## 2018-08-01 DIAGNOSIS — E785 Hyperlipidemia, unspecified: Secondary | ICD-10-CM

## 2018-08-01 DIAGNOSIS — K219 Gastro-esophageal reflux disease without esophagitis: Secondary | ICD-10-CM

## 2018-08-01 NOTE — Progress Notes (Signed)
   There were no vitals taken for this visit.   Subjective:    Patient ID: Justin Washington, male    DOB: [birth year], 63 y.o.   MRN: [id number]  HPI: Justin Washington is a 63 y.o. male presenting on 08/01/2018 for No chief complaint on file.   HPI   This is a telemedicine visit through [employer] due to the coronavirus pandemic.    I connected with  Justin Washington on 08/01/18 by a video enabled telemedicine application and verified that I am speaking with the correct person using two identifiers.   I discussed the limitations of evaluation and management by telemedicine. The patient expressed understanding and agreed to proceed.     Pt says globus sensation is gone.  He is now having No acid episodes.  He is feeling well.    Pt continues to smoke.  He is following CV19 guidelines with staying home except when he has to go to grocery or pharmacy.    Relevant past medical, surgical, family and social history reviewed and updated as indicated. Interim medical history since our last visit reviewed. Allergies and medications reviewed and updated.   Current Outpatient Medications:  .  omeprazole (PRILOSEC) 40 MG capsule, Take 1 capsule (40 mg total) by mouth daily., Disp: 30 capsule, Rfl: 3 .  simvastatin (ZOCOR) 10 MG tablet, Take 1 tablet (10 mg total) by mouth at bedtime., Disp: 30 tablet, Rfl: 4    Review of Systems  Per HPI unless specifically indicated above     Objective:    There were no vitals taken for this visit.  Wt Readings from Last 3 Encounters:  06/28/18 181 lb 8 oz (82.3 kg)  03/24/18 170 lb 12.8 oz (77.5 kg)  03/07/18 170 lb (77.1 kg)    Physical Exam HENT:     Head: Normocephalic and atraumatic.  Pulmonary:     Effort: Pulmonary effort is normal. No respiratory distress.  Neurological:     Mental Status: He is alert and oriented to person, place, and time.  Psychiatric:        Mood and Affect: Mood normal.        Behavior: Behavior normal.            Assessment & Plan:    Encounter Diagnoses  Name Primary?  . Gastroesophageal reflux disease, esophagitis presence not specified Yes  . Hyperlipidemia, unspecified hyperlipidemia type   . Tobacco use disorder     -Pt to continue current medications -Counseled smoking cessation but pt says he isn't ready to stop yet -Will mail [facility] application as he says he has been getting bills for labwork -Pt will follow up end of June to recheck lipids.  He will contact office sooner prn

## 2018-10-03 ENCOUNTER — Other Ambulatory Visit: Payer: Self-pay | Admitting: Physician Assistant

## 2018-10-03 DIAGNOSIS — E785 Hyperlipidemia, unspecified: Secondary | ICD-10-CM

## 2018-10-14 ENCOUNTER — Other Ambulatory Visit (HOSPITAL_COMMUNITY)
Admission: RE | Admit: 2018-10-14 | Discharge: 2018-10-14 | Disposition: A | Discharge status: Home / Self Care | Payer: Self-pay | Source: Ambulatory Visit | Attending: Physician Assistant | Admitting: Physician Assistant

## 2018-10-14 ENCOUNTER — Other Ambulatory Visit: Payer: Self-pay

## 2018-10-14 DIAGNOSIS — E785 Hyperlipidemia, unspecified: Secondary | ICD-10-CM | POA: Insufficient documentation

## 2018-10-14 LAB — COMPREHENSIVE METABOLIC PANEL
ALT: 18 U/L (ref 0–44)
AST: 20 U/L (ref 15–41)
Albumin: 4.6 g/dL (ref 3.5–5.0)
Alkaline Phosphatase: 50 U/L (ref 38–126)
Anion gap: 6 (ref 5–15)
BUN: 14 mg/dL (ref 8–23)
CO2: 26 mmol/L (ref 22–32)
Calcium: 9.3 mg/dL (ref 8.9–10.3)
Chloride: 105 mmol/L (ref 98–111)
Creatinine, Ser: 0.88 mg/dL (ref 0.61–1.24)
GFR calc Af Amer: >60 mL/min (ref >60)
GFR calc non Af Amer: >60 mL/min (ref >60)
Glucose, Bld: 106 mg/dL — ABNORMAL HIGH (ref 70–99)
Potassium: 4.9 mmol/L (ref 3.5–5.1)
Sodium: 137 mmol/L (ref 135–145)
Total Bilirubin: 0.7 mg/dL (ref 0.3–1.2)
Total Protein: 7.6 g/dL (ref 6.5–8.1)

## 2018-10-14 LAB — LIPID PANEL
Cholesterol: 170 mg/dL (ref 0–200)
HDL: 46 mg/dL (ref >40)
LDL Cholesterol: 117 mg/dL — ABNORMAL HIGH (ref 0–99)
Total CHOL/HDL Ratio: 3.7 RATIO
Triglycerides: 33 mg/dL (ref <150)
VLDL: 7 mg/dL (ref 0–40)

## 2018-10-17 ENCOUNTER — Ambulatory Visit: Payer: Self-pay | Admitting: Physician Assistant

## 2018-10-17 ENCOUNTER — Encounter: Payer: Self-pay | Admitting: Physician Assistant

## 2018-10-17 DIAGNOSIS — F17200 Nicotine dependence, unspecified, uncomplicated: Secondary | ICD-10-CM

## 2018-10-17 DIAGNOSIS — Z125 Encounter for screening for malignant neoplasm of prostate: Secondary | ICD-10-CM

## 2018-10-17 DIAGNOSIS — K219 Gastro-esophageal reflux disease without esophagitis: Secondary | ICD-10-CM

## 2018-10-17 DIAGNOSIS — E785 Hyperlipidemia, unspecified: Secondary | ICD-10-CM

## 2018-10-17 MED ORDER — OMEPRAZOLE 40 MG PO CPDR: 40.0000 mg | DELAYED_RELEASE_CAPSULE | Freq: Every day | ORAL | 6 refills | Status: DC

## 2018-10-17 MED ORDER — SIMVASTATIN 10 MG PO TABS: 10.0000 mg | ORAL_TABLET | Freq: Every day | ORAL | 6 refills | Status: DC

## 2018-10-17 NOTE — Progress Notes (Signed)
There were no vitals taken for this visit.   Subjective:    Patient ID: Justin Washington, male    DOB: 1955/10/21, 63 y.o.   MRN: [id number]  HPI: Justin Washington is a 64 y.o. male presenting on 10/17/2018 for No chief complaint on file.   HPI This is a telemedicine appointment through [employer] due to coronavirus pandemic.    I connected with  Justin Washington on 10/17/18 by a video enabled telemedicine application and verified that I am speaking with the correct person using two identifiers.   I discussed the limitations of evaluation and management by telemedicine. The patient expressed understanding and agreed to proceed.   Pt is at home.  Provider is at office  Pt says he is doing well.  He is staying active.  He stays at home mostly but does wear a mask when he goes out.  He has no complaints today.  He is still smoking.   Relevant past medical, surgical, family and social history reviewed and updated as indicated. Interim medical history since our last visit reviewed. Allergies and medications reviewed and updated.   Current Outpatient Medications:  .  omeprazole (PRILOSEC) 40 MG capsule, Take 1 capsule (40 mg total) by mouth daily., Disp: 30 capsule, Rfl: 3 .  simvastatin (ZOCOR) 10 MG tablet, Take 1 tablet (10 mg total) by mouth at bedtime., Disp: 30 tablet, Rfl: 4    Review of Systems  Per HPI unless specifically indicated above     Objective:    There were no vitals taken for this visit.  Wt Readings from Last 3 Encounters:  06/28/18 181 lb 8 oz (82.3 kg)  03/24/18 170 lb 12.8 oz (77.5 kg)  03/07/18 170 lb (77.1 kg)    Physical Exam Constitutional:      General: He is not in acute distress.    Appearance: Normal appearance. He is not ill-appearing.  HENT:     Head: Normocephalic and atraumatic.  Pulmonary:     Effort: Pulmonary effort is normal. No respiratory distress.  Neurological:     Mental Status: He is alert and oriented to person, place, and time.   Psychiatric:        Attention and Perception: Attention normal.        Mood and Affect: Mood normal.        Speech: Speech normal.        Behavior: Behavior normal. Behavior is cooperative.        Cognition and Memory: Cognition normal.     Results for orders placed or performed during the hospital encounter of 10/14/18  Lipid panel  Result Value Ref Range   Cholesterol 170 0 - 200 mg/dL   Triglycerides 33 <150 mg/dL   HDL 46 >40 mg/dL   Total CHOL/HDL Ratio 3.7 RATIO   VLDL 7 0 - 40 mg/dL   LDL Cholesterol 117 (H) 0 - 99 mg/dL  Comprehensive metabolic panel  Result Value Ref Range   Sodium 137 135 - 145 mmol/L   Potassium 4.9 3.5 - 5.1 mmol/L   Chloride 105 98 - 111 mmol/L   CO2 26 22 - 32 mmol/L   Glucose, Bld 106 (H) 70 - 99 mg/dL   BUN 14 8 - 23 mg/dL   Creatinine, Ser 0.88 0.61 - 1.24 mg/dL   Calcium 9.3 8.9 - 10.3 mg/dL   Total Protein 7.6 6.5 - 8.1 g/dL   Albumin 4.6 3.5 - 5.0 g/dL   AST 20 15 -  41 U/L   ALT 18 0 - 44 U/L   Alkaline Phosphatase 50 38 - 126 U/L   Total Bilirubin 0.7 0.3 - 1.2 mg/dL   GFR calc non Af Amer >60 >60 mL/min   GFR calc Af Amer >60 >60 mL/min   Anion gap 6 5 - 15      Assessment & Plan:    Encounter Diagnoses  Name Primary?  . Hyperlipidemia, unspecified hyperlipidemia type Yes  . Gastroesophageal reflux disease, esophagitis presence not specified   . Tobacco use disorder      -Reviewed labs with pt -Encouraged pt to wear mask when he goes out per CDC guideline -Encouraged smoking cessation -encouraged pt to continue to be active/exercise regularly -pt to follow up in 3 months.  He is to contact office sooner prn

## 2019-02-03 ENCOUNTER — Other Ambulatory Visit (HOSPITAL_COMMUNITY)
Admission: RE | Admit: 2019-02-03 | Discharge: 2019-02-03 | Disposition: A | Discharge status: Home / Self Care | Payer: Self-pay | Source: Ambulatory Visit | Attending: Physician Assistant | Admitting: Physician Assistant

## 2019-02-03 DIAGNOSIS — E785 Hyperlipidemia, unspecified: Secondary | ICD-10-CM | POA: Insufficient documentation

## 2019-02-03 DIAGNOSIS — Z125 Encounter for screening for malignant neoplasm of prostate: Secondary | ICD-10-CM | POA: Insufficient documentation

## 2019-02-03 LAB — COMPREHENSIVE METABOLIC PANEL
ALT: 17 U/L (ref 0–44)
AST: 20 U/L (ref 15–41)
Albumin: 4.1 g/dL (ref 3.5–5.0)
Alkaline Phosphatase: 51 U/L (ref 38–126)
Anion gap: 8 (ref 5–15)
BUN: 15 mg/dL (ref 8–23)
CO2: 24 mmol/L (ref 22–32)
Calcium: 8.6 mg/dL — ABNORMAL LOW (ref 8.9–10.3)
Chloride: 105 mmol/L (ref 98–111)
Creatinine, Ser: 0.86 mg/dL (ref 0.61–1.24)
GFR calc Af Amer: >60 mL/min (ref >60)
GFR calc non Af Amer: >60 mL/min (ref >60)
Glucose, Bld: 104 mg/dL — ABNORMAL HIGH (ref 70–99)
Potassium: 4.7 mmol/L (ref 3.5–5.1)
Sodium: 137 mmol/L (ref 135–145)
Total Bilirubin: 0.5 mg/dL (ref 0.3–1.2)
Total Protein: 7.3 g/dL (ref 6.5–8.1)

## 2019-02-03 LAB — LIPID PANEL
Cholesterol: 169 mg/dL (ref 0–200)
HDL: 45 mg/dL (ref >40)
LDL Cholesterol: 112 mg/dL — ABNORMAL HIGH (ref 0–99)
Total CHOL/HDL Ratio: 3.8 RATIO
Triglycerides: 60 mg/dL (ref <150)
VLDL: 12 mg/dL (ref 0–40)

## 2019-02-03 LAB — PSA: Prostatic Specific Antigen: 2.77 ng/mL (ref 0.00–4.00)

## 2019-02-06 ENCOUNTER — Other Ambulatory Visit: Payer: Self-pay

## 2019-02-06 ENCOUNTER — Encounter: Payer: Self-pay | Admitting: Physician Assistant

## 2019-02-06 ENCOUNTER — Ambulatory Visit: Payer: Self-pay | Admitting: Physician Assistant

## 2019-02-06 VITALS — BP 124/70 | HR 77 | Temp 98.1°F | Wt 184.7 lb

## 2019-02-06 DIAGNOSIS — K219 Gastro-esophageal reflux disease without esophagitis: Secondary | ICD-10-CM

## 2019-02-06 DIAGNOSIS — E785 Hyperlipidemia, unspecified: Secondary | ICD-10-CM

## 2019-02-06 DIAGNOSIS — F17200 Nicotine dependence, unspecified, uncomplicated: Secondary | ICD-10-CM

## 2019-02-06 MED ORDER — SIMVASTATIN 20 MG PO TABS: 20.0000 mg | ORAL_TABLET | Freq: Every day | ORAL | 4 refills | Status: DC

## 2019-02-06 NOTE — Progress Notes (Signed)
BP 124/70   Pulse 77   Temp 98.1 F (36.7 C)   Wt 184 lb 11.2 oz (83.8 kg)   SpO2 97%   BMI 28.08 kg/m    Subjective:    Patient ID: Justin Washington, male    DOB: Jun 11, 1955, 63 y.o.   MRN: [id number]  HPI: Justin Washington is a 63 y.o. male presenting on 02/06/2019 for Hyperlipidemia and Gastroesophageal Reflux   HPI    Pt had a negative covid 19 screening questionnaire   Patient says he is doing well.  He has no complaints today.  Patient is having no complaints of sore throat or feeling that something is stuck in his throat today.  He denies symptoms of reflux  Patient says he is still smoking but if he is trying to work on quitting.    Relevant past medical, surgical, family and social history reviewed and updated as indicated. Interim medical history since our last visit reviewed. Allergies and medications reviewed and updated.    Current Outpatient Medications:  .  omeprazole (PRILOSEC) 40 MG capsule, Take 1 capsule (40 mg total) by mouth daily., Disp: 30 capsule, Rfl: 6 .  simvastatin (ZOCOR) 10 MG tablet, Take 1 tablet (10 mg total) by mouth at bedtime., Disp: 30 tablet, Rfl: 6     Review of Systems  Per HPI unless specifically indicated above     Objective:    BP 124/70   Pulse 77   Temp 98.1 F (36.7 C)   Wt 184 lb 11.2 oz (83.8 kg)   SpO2 97%   BMI 28.08 kg/m   Wt Readings from Last 3 Encounters:  02/06/19 184 lb 11.2 oz (83.8 kg)  06/28/18 181 lb 8 oz (82.3 kg)  03/24/18 170 lb 12.8 oz (77.5 kg)    Physical Exam Vitals signs reviewed.  Constitutional:      General: He is not in acute distress.    Appearance: Normal appearance. He is well-developed. He is not ill-appearing.  HENT:     Head: Normocephalic and atraumatic.  Neck:     Musculoskeletal: Neck supple.  Cardiovascular:     Rate and Rhythm: Normal rate and regular rhythm.  Pulmonary:     Effort: Pulmonary effort is normal.     Breath sounds: Normal breath sounds. No  wheezing.  Abdominal:     General: Bowel sounds are normal.     Palpations: Abdomen is soft.     Tenderness: There is no abdominal tenderness.  Musculoskeletal:     Right lower leg: No edema.     Left lower leg: No edema.  Lymphadenopathy:     Cervical: No cervical adenopathy.  Skin:    General: Skin is warm and dry.  Neurological:     Mental Status: He is alert and oriented to person, place, and time.  Psychiatric:        Attention and Perception: Attention normal.        Mood and Affect: Mood normal.        Speech: Speech normal.        Behavior: Behavior normal. Behavior is cooperative.     Results for orders placed or performed during the hospital encounter of 02/03/19  PSA  Result Value Ref Range   Prostatic Specific Antigen 2.77 0.00 - 4.00 ng/mL  Lipid panel  Result Value Ref Range   Cholesterol 169 0 - 200 mg/dL   Triglycerides 60 <150 mg/dL   HDL 45 >40 mg/dL   Total  CHOL/HDL Ratio 3.8 RATIO   VLDL 12 0 - 40 mg/dL   LDL Cholesterol 591 (H) 0 - 99 mg/dL  Comprehensive metabolic panel  Result Value Ref Range   Sodium 137 135 - 145 mmol/L   Potassium 4.7 3.5 - 5.1 mmol/L   Chloride 105 98 - 111 mmol/L   CO2 24 22 - 32 mmol/L   Glucose, Bld 104 (H) 70 - 99 mg/dL   BUN 15 8 - 23 mg/dL   Creatinine, Ser 6.38 0.61 - 1.24 mg/dL   Calcium 8.6 (L) 8.9 - 10.3 mg/dL   Total Protein 7.3 6.5 - 8.1 g/dL   Albumin 4.1 3.5 - 5.0 g/dL   AST 20 15 - 41 U/L   ALT 17 0 - 44 U/L   Alkaline Phosphatase 51 38 - 126 U/L   Total Bilirubin 0.5 0.3 - 1.2 mg/dL   GFR calc non Af Amer >60 >60 mL/min   GFR calc Af Amer >60 >60 mL/min   Anion gap 8 5 - 15      Assessment & Plan:    Encounter Diagnoses  Name Primary?  . Hyperlipidemia, unspecified hyperlipidemia type Yes  . Tobacco use disorder   . Gastroesophageal reflux disease, unspecified whether esophagitis present     -Reviewed labs with pt  -Discussed increase in simvastatin.  He agreed.  He is to continue working on  following a low-fat diet and getting regular exercise. -Encouraged smoking cessation -Patient declined Influenza vaccination -Patient was given ifbot for colon cancer screening -Patient is to continue to wear a mask when in public and follow guidelines to reduce risk of coronavirus transmission per CDC guidelines -Patient to follow-up with evisit 3 months.  He is to contact office sooner if needed

## 2019-02-21 ENCOUNTER — Other Ambulatory Visit: Payer: Self-pay | Admitting: Physician Assistant

## 2019-02-21 DIAGNOSIS — Z1211 Encounter for screening for malignant neoplasm of colon: Secondary | ICD-10-CM

## 2019-02-21 LAB — IFOBT (OCCULT BLOOD): IFOBT: NEGATIVE

## 2019-05-09 ENCOUNTER — Other Ambulatory Visit (HOSPITAL_COMMUNITY)
Admission: RE | Admit: 2019-05-09 | Discharge: 2019-05-09 | Disposition: A | Discharge status: Home / Self Care | Payer: Self-pay | Source: Ambulatory Visit | Attending: Physician Assistant | Admitting: Physician Assistant

## 2019-05-09 DIAGNOSIS — E785 Hyperlipidemia, unspecified: Secondary | ICD-10-CM | POA: Insufficient documentation

## 2019-05-09 LAB — COMPREHENSIVE METABOLIC PANEL
ALT: 21 U/L (ref 0–44)
AST: 22 U/L (ref 15–41)
Albumin: 4.4 g/dL (ref 3.5–5.0)
Alkaline Phosphatase: 50 U/L (ref 38–126)
Anion gap: 8 (ref 5–15)
BUN: 16 mg/dL (ref 8–23)
CO2: 27 mmol/L (ref 22–32)
Calcium: 9.2 mg/dL (ref 8.9–10.3)
Chloride: 103 mmol/L (ref 98–111)
Creatinine, Ser: 0.92 mg/dL (ref 0.61–1.24)
GFR calc Af Amer: >60 mL/min (ref >60)
GFR calc non Af Amer: >60 mL/min (ref >60)
Glucose, Bld: 104 mg/dL — ABNORMAL HIGH (ref 70–99)
Potassium: 4.7 mmol/L (ref 3.5–5.1)
Sodium: 138 mmol/L (ref 135–145)
Total Bilirubin: 0.6 mg/dL (ref 0.3–1.2)
Total Protein: 7.6 g/dL (ref 6.5–8.1)

## 2019-05-09 LAB — LIPID PANEL
Cholesterol: 167 mg/dL (ref 0–200)
HDL: 49 mg/dL (ref >40)
LDL Cholesterol: 109 mg/dL — ABNORMAL HIGH (ref 0–99)
Total CHOL/HDL Ratio: 3.4 RATIO
Triglycerides: 47 mg/dL (ref <150)
VLDL: 9 mg/dL (ref 0–40)

## 2019-05-10 ENCOUNTER — Encounter: Payer: Self-pay | Admitting: Physician Assistant

## 2019-05-10 ENCOUNTER — Ambulatory Visit: Payer: Self-pay | Admitting: Physician Assistant

## 2019-05-10 DIAGNOSIS — K219 Gastro-esophageal reflux disease without esophagitis: Secondary | ICD-10-CM

## 2019-05-10 DIAGNOSIS — F17200 Nicotine dependence, unspecified, uncomplicated: Secondary | ICD-10-CM

## 2019-05-10 DIAGNOSIS — E785 Hyperlipidemia, unspecified: Secondary | ICD-10-CM

## 2019-05-10 MED ORDER — OMEPRAZOLE 40 MG PO CPDR: 40.0000 mg | DELAYED_RELEASE_CAPSULE | Freq: Every day | ORAL | 6 refills | Status: DC

## 2019-05-10 MED ORDER — SIMVASTATIN 20 MG PO TABS: 20.0000 mg | ORAL_TABLET | Freq: Every day | ORAL | 4 refills | Status: DC

## 2019-05-10 NOTE — Progress Notes (Signed)
There were no vitals taken for this visit.   Subjective:    Patient ID: Justin Washington, male    DOB: 08-11-1955, 64 y.o.   MRN: [id number]  HPI: Justin Washington is a 64 y.o. male presenting on 05/10/2019 for No chief complaint on file.   HPI  This is a telemedicine appointment through [employer] due to coronavirus pandemic.  I connected with  Justin Washington on 05/10/19 by a video enabled telemedicine application and verified that I am speaking with the correct person using two identifiers.   I discussed the limitations of evaluation and management by telemedicine. The patient expressed understanding and agreed to proceed.  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   Pt is 54yoM with dyslipidemia and GERD.  He continues to smoke.  He says he is doing very well and he has no complaints today.   He starts on medicare on June 1.      Relevant past medical, surgical, family and social history reviewed and updated as indicated. Interim medical history since our last visit reviewed. Allergies and medications reviewed and updated.   Current Outpatient Medications:  .  omeprazole (PRILOSEC) 40 MG capsule, Take 1 capsule (40 mg total) by mouth daily., Disp: 30 capsule, Rfl: 6 .  simvastatin (ZOCOR) 20 MG tablet, Take 1 tablet (20 mg total) by mouth at bedtime., Disp: 30 tablet, Rfl: 4    Review of Systems  Per HPI unless specifically indicated above     Objective:    There were no vitals taken for this visit.  Wt Readings from Last 3 Encounters:  02/06/19 184 lb 11.2 oz (83.8 kg)  06/28/18 181 lb 8 oz (82.3 kg)  03/24/18 170 lb 12.8 oz (77.5 kg)    Physical Exam Constitutional:      General: He is not in acute distress.    Appearance: Normal appearance. He is not ill-appearing.  HENT:     Head: Normocephalic and atraumatic.  Pulmonary:     Effort: Pulmonary effort is normal. No respiratory distress.  Neurological:     Mental Status: He is alert and oriented to  person, place, and time.  Psychiatric:        Attention and Perception: Attention normal.        Mood and Affect: Mood normal.        Speech: Speech normal.        Behavior: Behavior is cooperative.     Comments: Pt is his usual pleasant self.      Results for orders placed or performed during the hospital encounter of 05/09/19  Lipid panel  Result Value Ref Range   Cholesterol 167 0 - 200 mg/dL   Triglycerides 47 <150 mg/dL   HDL 49 >40 mg/dL   Total CHOL/HDL Ratio 3.4 RATIO   VLDL 9 0 - 40 mg/dL   LDL Cholesterol 109 (H) 0 - 99 mg/dL  Comprehensive metabolic panel  Result Value Ref Range   Sodium 138 135 - 145 mmol/L   Potassium 4.7 3.5 - 5.1 mmol/L   Chloride 103 98 - 111 mmol/L   CO2 27 22 - 32 mmol/L   Glucose, Bld 104 (H) 70 - 99 mg/dL   BUN 16 8 - 23 mg/dL   Creatinine, Ser 0.92 0.61 - 1.24 mg/dL   Calcium 9.2 8.9 - 10.3 mg/dL   Total Protein 7.6 6.5 - 8.1 g/dL   Albumin 4.4 3.5 - 5.0 g/dL   AST  22 15 - 41 U/L   ALT 21 0 - 44 U/L   Alkaline Phosphatase 50 38 - 126 U/L   Total Bilirubin 0.6 0.3 - 1.2 mg/dL   GFR calc non Af Amer >60 >60 mL/min   GFR calc Af Amer >60 >60 mL/min   Anion gap 8 5 - 15      Assessment & Plan:    Encounter Diagnoses  Name Primary?  . Hyperlipidemia, unspecified hyperlipidemia type Yes  . Tobacco use disorder   . Gastroesophageal reflux disease, unspecified whether esophagitis present      -reviewed labs with pt -pt to continue current medications and lowfat diet -encouraged smoking cessation -pt to follow up in 3 months.  He is to contact office sooner prn

## 2019-08-07 ENCOUNTER — Other Ambulatory Visit (HOSPITAL_COMMUNITY)
Admission: RE | Admit: 2019-08-07 | Discharge: 2019-08-07 | Disposition: A | Discharge status: Home / Self Care | Payer: Self-pay | Source: Ambulatory Visit | Attending: Physician Assistant | Admitting: Physician Assistant

## 2019-08-07 DIAGNOSIS — E785 Hyperlipidemia, unspecified: Secondary | ICD-10-CM | POA: Insufficient documentation

## 2019-08-07 LAB — COMPREHENSIVE METABOLIC PANEL
ALT: 22 U/L (ref 0–44)
AST: 22 U/L (ref 15–41)
Albumin: 4.3 g/dL (ref 3.5–5.0)
Alkaline Phosphatase: 45 U/L (ref 38–126)
Anion gap: 10 (ref 5–15)
BUN: 16 mg/dL (ref 8–23)
CO2: 23 mmol/L (ref 22–32)
Calcium: 9.2 mg/dL (ref 8.9–10.3)
Chloride: 103 mmol/L (ref 98–111)
Creatinine, Ser: 0.87 mg/dL (ref 0.61–1.24)
GFR calc Af Amer: >60 mL/min (ref >60)
GFR calc non Af Amer: >60 mL/min (ref >60)
Glucose, Bld: 112 mg/dL — ABNORMAL HIGH (ref 70–99)
Potassium: 4.3 mmol/L (ref 3.5–5.1)
Sodium: 136 mmol/L (ref 135–145)
Total Bilirubin: 0.5 mg/dL (ref 0.3–1.2)
Total Protein: 7.5 g/dL (ref 6.5–8.1)

## 2019-08-07 LAB — LIPID PANEL
Cholesterol: 194 mg/dL (ref 0–200)
HDL: 55 mg/dL (ref >40)
LDL Cholesterol: 122 mg/dL — ABNORMAL HIGH (ref 0–99)
Total CHOL/HDL Ratio: 3.5 RATIO
Triglycerides: 86 mg/dL (ref <150)
VLDL: 17 mg/dL (ref 0–40)

## 2019-08-08 ENCOUNTER — Ambulatory Visit: Payer: Self-pay | Admitting: Physician Assistant

## 2019-08-08 ENCOUNTER — Encounter: Payer: Self-pay | Admitting: Physician Assistant

## 2019-08-08 ENCOUNTER — Other Ambulatory Visit: Payer: Self-pay

## 2019-08-08 VITALS — BP 126/80 | HR 87 | Temp 98.6°F | Wt 183.4 lb

## 2019-08-08 DIAGNOSIS — K219 Gastro-esophageal reflux disease without esophagitis: Secondary | ICD-10-CM

## 2019-08-08 DIAGNOSIS — E785 Hyperlipidemia, unspecified: Secondary | ICD-10-CM

## 2019-08-08 DIAGNOSIS — F17200 Nicotine dependence, unspecified, uncomplicated: Secondary | ICD-10-CM

## 2019-08-08 MED ORDER — OMEPRAZOLE 40 MG PO CPDR: 40.0000 mg | DELAYED_RELEASE_CAPSULE | Freq: Every day | ORAL | 4 refills | Status: AC

## 2019-08-08 MED ORDER — SIMVASTATIN 20 MG PO TABS: 20.0000 mg | ORAL_TABLET | Freq: Every day | ORAL | 4 refills | Status: AC

## 2019-08-08 NOTE — Progress Notes (Signed)
BP 126/80   Pulse 87   Temp 98.6 F (37 C)   Wt 183 lb 6.4 oz (83.2 kg)   SpO2 94%   BMI 27.89 kg/m    Subjective:    Patient ID: Justin Washington, male    DOB: [birth year], 64 y.o.   MRN: [id number]  HPI: Justin Washington is a 64 y.o. male presenting on 08/08/2019 for Hyperlipidemia and Gastroesophageal Reflux   HPI  Pt had a negative covid 19 screening questionnaire.    Pt is 23yoM with dyslipidemia and GERD.   He has remote history of substance abuse but has abstained for over 7 years and is doing great with that.   He continues to smoke.  He says he is doing very well and he has no complaints today.    He denies CP, sob, abdominal pain.   He starts on medicare on June 1.  Pt is active but doesn't exercise.     Relevant past medical, surgical, family and social history reviewed and updated as indicated. Interim medical history since our last visit reviewed. Allergies and medications reviewed and updated.   Current Outpatient Medications:  .  omeprazole (PRILOSEC) 40 MG capsule, Take 1 capsule (40 mg total) by mouth daily., Disp: 30 capsule, Rfl: 6 .  simvastatin (ZOCOR) 20 MG tablet, Take 1 tablet (20 mg total) by mouth at bedtime., Disp: 30 tablet, Rfl: 4    Review of Systems  Per HPI unless specifically indicated above     Objective:    BP 126/80   Pulse 87   Temp 98.6 F (37 C)   Wt 183 lb 6.4 oz (83.2 kg)   SpO2 94%   BMI 27.89 kg/m   Wt Readings from Last 3 Encounters:  08/08/19 183 lb 6.4 oz (83.2 kg)  02/06/19 184 lb 11.2 oz (83.8 kg)  06/28/18 181 lb 8 oz (82.3 kg)    Physical Exam Vitals reviewed.  Constitutional:      General: He is not in acute distress.    Appearance: Normal appearance. He is well-developed. He is not ill-appearing.  HENT:     Head: Normocephalic and atraumatic.  Cardiovascular:     Rate and Rhythm: Normal rate and regular rhythm.  Pulmonary:     Effort: Pulmonary effort is normal.     Breath sounds: Normal breath  sounds. No wheezing.  Abdominal:     General: Bowel sounds are normal.     Palpations: Abdomen is soft.     Tenderness: There is no abdominal tenderness.  Musculoskeletal:     Cervical back: Neck supple.     Right lower leg: No edema.     Left lower leg: No edema.  Lymphadenopathy:     Cervical: No cervical adenopathy.  Skin:    General: Skin is warm and dry.  Neurological:     Mental Status: He is alert and oriented to person, place, and time.  Psychiatric:        Attention and Perception: Attention normal.        Mood and Affect: Mood normal.        Behavior: Behavior normal. Behavior is cooperative.     Results for orders placed or performed during the hospital encounter of 08/07/19  Lipid panel  Result Value Ref Range   Cholesterol 194 0 - 200 mg/dL   Triglycerides 86 <150 mg/dL   HDL 55 >40 mg/dL   Total CHOL/HDL Ratio 3.5 RATIO   VLDL 17 0 -  40 mg/dL   LDL Cholesterol 409 (H) 0 - 99 mg/dL  Comprehensive metabolic panel  Result Value Ref Range   Sodium 136 135 - 145 mmol/L   Potassium 4.3 3.5 - 5.1 mmol/L   Chloride 103 98 - 111 mmol/L   CO2 23 22 - 32 mmol/L   Glucose, Bld 112 (H) 70 - 99 mg/dL   BUN 16 8 - 23 mg/dL   Creatinine, Ser 8.11 0.61 - 1.24 mg/dL   Calcium 9.2 8.9 - 91.4 mg/dL   Total Protein 7.5 6.5 - 8.1 g/dL   Albumin 4.3 3.5 - 5.0 g/dL   AST 22 15 - 41 U/L   ALT 22 0 - 44 U/L   Alkaline Phosphatase 45 38 - 126 U/L   Total Bilirubin 0.5 0.3 - 1.2 mg/dL   GFR calc non Af Amer >60 >60 mL/min   GFR calc Af Amer >60 >60 mL/min   Anion gap 10 5 - 15      Assessment & Plan:      1. Dyslipidemia Reviewed labs with pt.  Counseled pt on lowfat diet.  Discussed role that exercise and smoking cessation can play in improving lipids.  Pt to continue simvastatin.  He does not want to increase dosage at this time.  2. GERD Well controlled on omeprazole  3. Tobacco use disorder Encouraged cessation   Discussed with pt that he will be getting  medicare June 1 and will thus no longer be eligible for services at [facility].  He is encouraged to go ahead now and call around to get appointment with new PCP as sometimes there can be a wait of several months for new pt appointment.  He is to contact our office if he has any needs prior to June 1.

## 2019-10-13 IMAGING — CT CT ABD-PELV W/ CM
2 of 5 series · 16 of 46 positions shown, 18 images · IV contrast (Isovue)
Comparison: None.

CLINICAL DATA: Mid abdominal pain and burning for 3 days with
nausea.

EXAM:
CT ABDOMEN AND PELVIS WITH CONTRAST
TECHNIQUE: Multidetector CT imaging of the abdomen and pelvis was performed
using the standard protocol following bolus administration of
intravenous contrast.
CONTRAST:  100mL CLZAV7-FQQ IOPAMIDOL (CLZAV7-FQQ) INJECTION 61%

[Series 2: axial st · axial · 0.78mm/px · z∈[+938,+1343]mm · 13 of 93 slices shown, 15 images]
[im 6/93  soft-tissue]
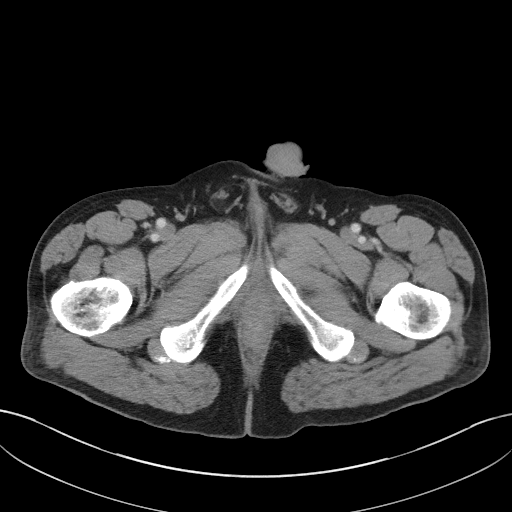
[im 6/93  bone]
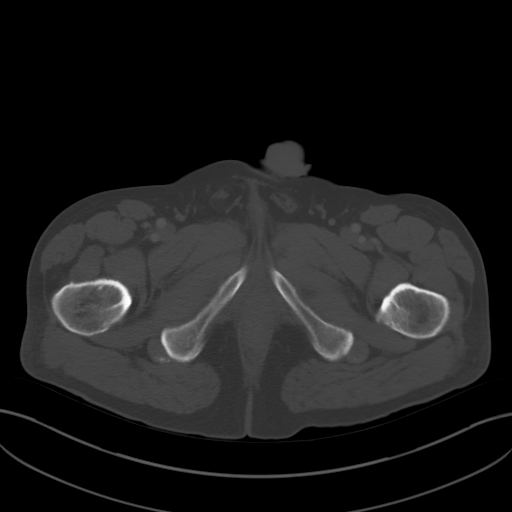
[im 11/93  soft-tissue]
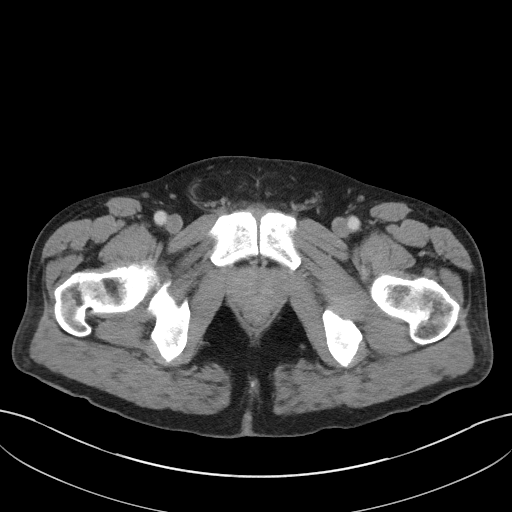
[im 22/93  soft-tissue]
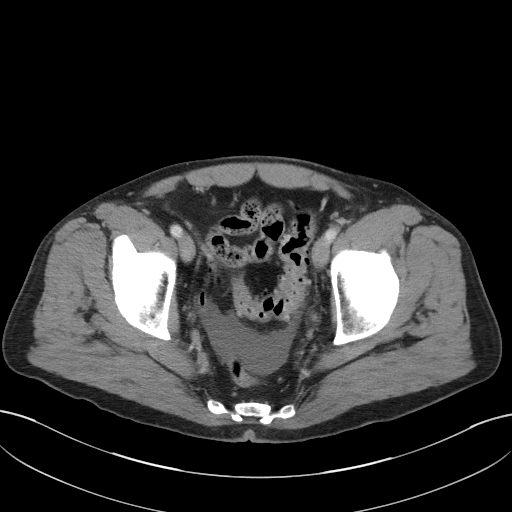
[im 28/93  soft-tissue]
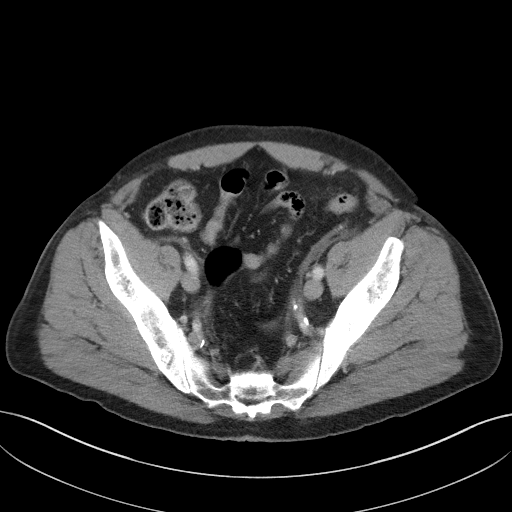
[im 33/93  soft-tissue]
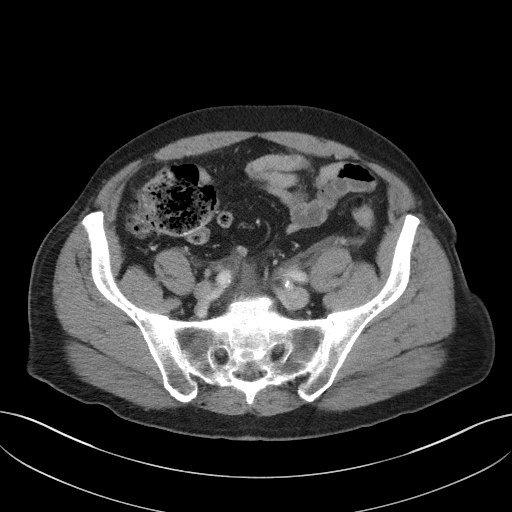
[im 38/93  soft-tissue]
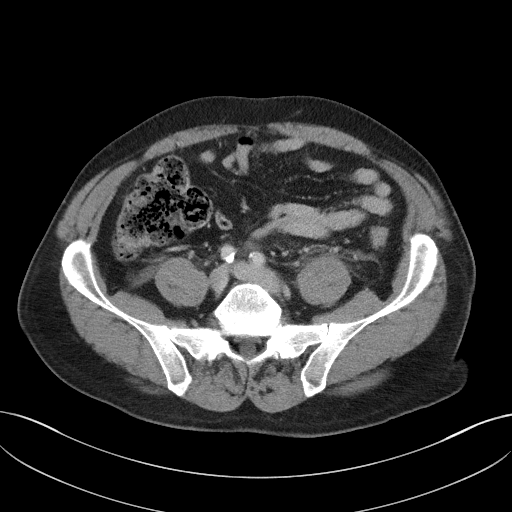
[im 49/93  soft-tissue]
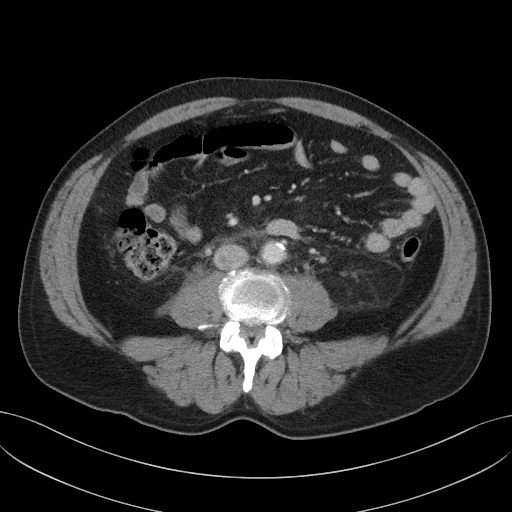
[im 55/93  soft-tissue]
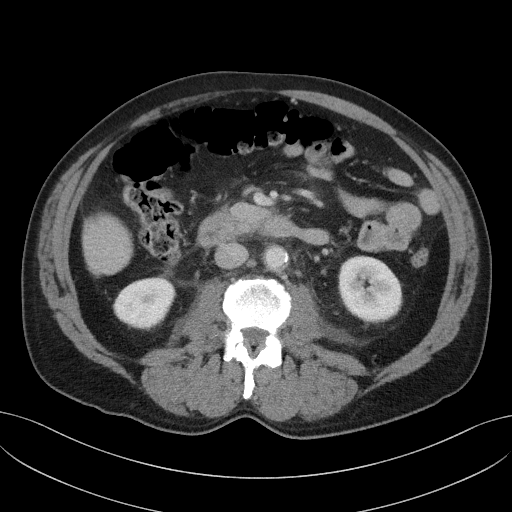
[im 60/93  soft-tissue]
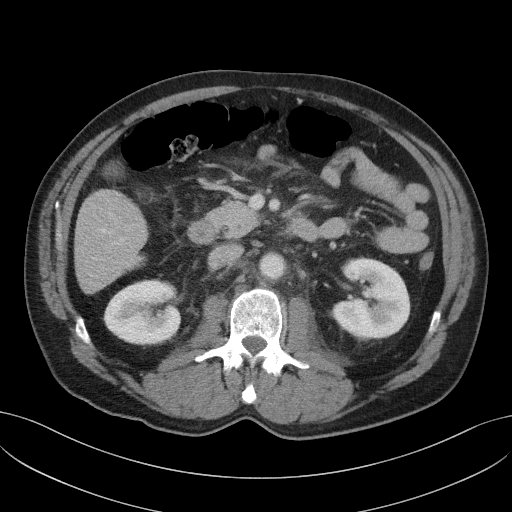
[im 60/93  bone]
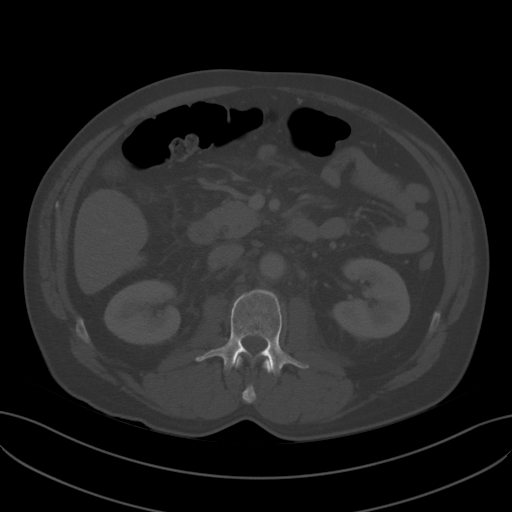
[im 65/93  soft-tissue]
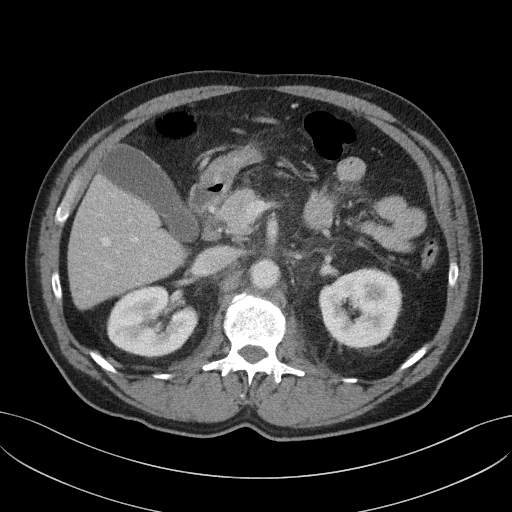
[im 71/93  soft-tissue]
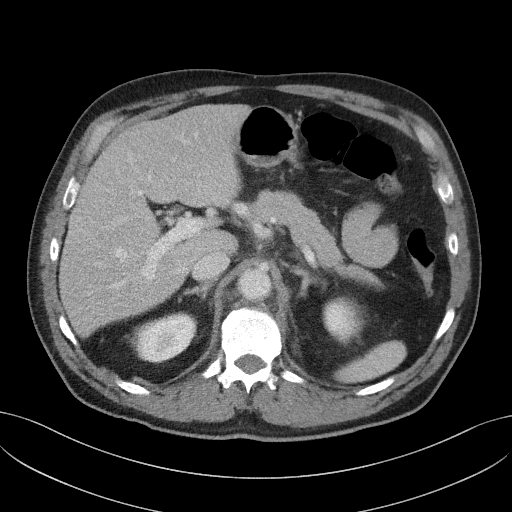
[im 82/93  soft-tissue]
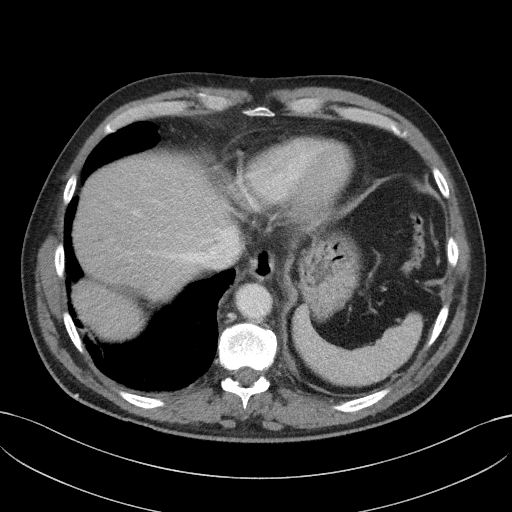
[im 87/93  soft-tissue]
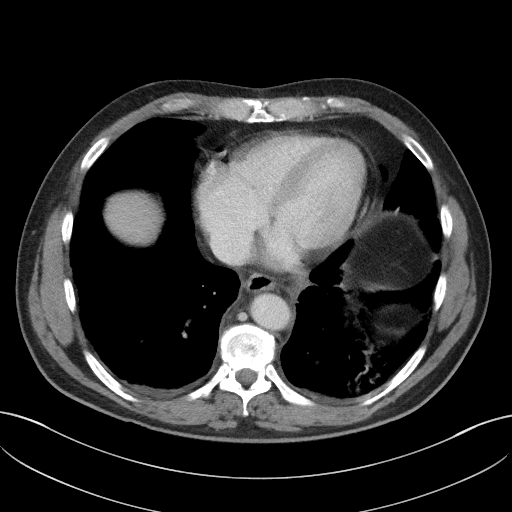

[Series 6: coronal st · coronal · 0.81mm/px · 3 of 115 slices shown]
[im 39/115  soft-tissue]
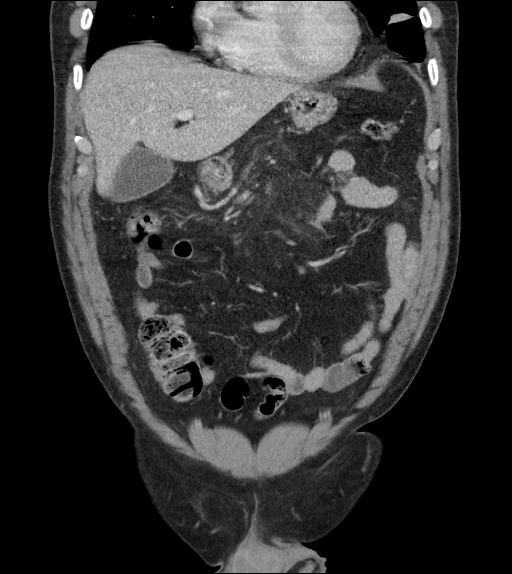
[im 51/115  soft-tissue]
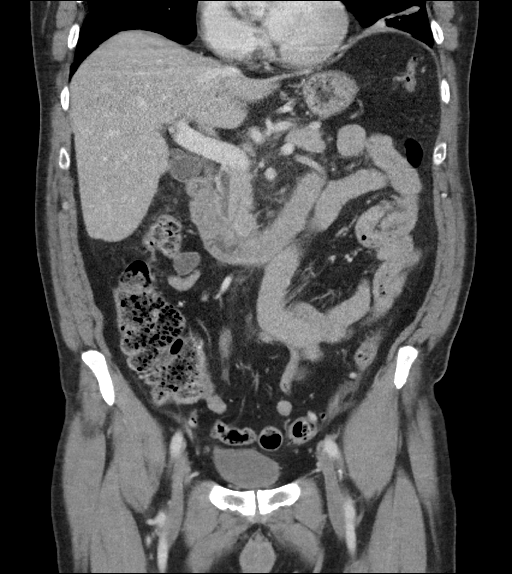
[im 64/115  soft-tissue]
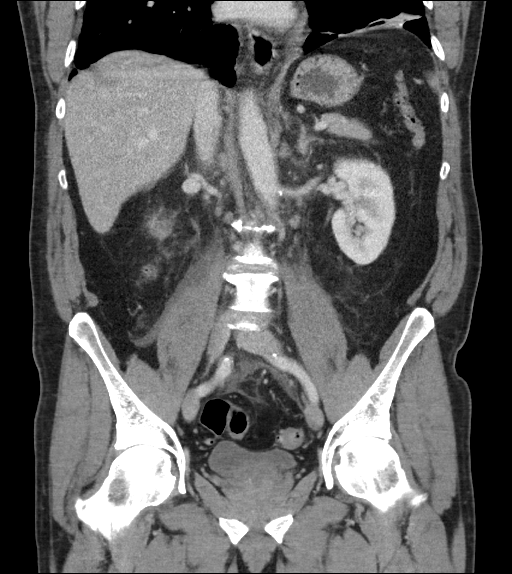

[16 of 46 positions shown; findings below may reference images not displayed]

FINDINGS: Lower chest: Small bilateral pleural effusions. Left greater than
right basilar atelectasis. Coronary artery atherosclerosis.

Hepatobiliary: No focal liver abnormality is seen. No gallstones,
gallbladder wall thickening, or biliary dilatation.

Pancreas: No pancreatic ductal dilatation. Diffuse retroperitoneal
inflammatory stranding does not appear centered about the pancreas.

Spleen: Unremarkable.

Adrenals/Urinary Tract: Unremarkable adrenal glands. No evidence of
renal calculi or hydronephrosis. Subcentimeter low-density lesion in
the right kidney, too small to fully characterize.

Stomach/Bowel: The stomach is within normal limits. There is no
evidence of bowel obstruction. Retroperitoneal stranding extends to
the third portion of the duodenum. Left-sided colonic diverticulosis
is noted without evidence of diverticulitis. The appendix is
unremarkable.

Vascular/Lymphatic: Abdominal aortic atherosclerosis without
aneurysm. Increased number of subcentimeter retroperitoneal lymph
nodes, likely reactive.

Reproductive: Mild prostatic enlargement.

Other: Small volume intraperitoneal free fluid. Moderate diffuse
retroperitoneal inflammatory stranding. Small fat-containing right
inguinal hernia.

Musculoskeletal: Mild thoracolumbar disc degeneration. No suspicious
osseous lesion.
IMPRESSION: 1. Moderate inflammatory stranding throughout the retroperitoneum.
This is of uncertain etiology, however duodenitis is a
consideration. While the inflammation is not focused around the
pancreas, pancreatitis remains a less likely consideration and
correlation with serum lipase is recommended.
2. Small pleural effusions and bibasilar atelectasis.
3. Colonic diverticulosis.
4.  Aortic Atherosclerosis ([id number]).

## 2021-12-04 ENCOUNTER — Emergency Department (HOSPITAL_COMMUNITY): Payer: Self-pay

## 2021-12-04 ENCOUNTER — Other Ambulatory Visit: Payer: Self-pay

## 2021-12-04 ENCOUNTER — Encounter (HOSPITAL_COMMUNITY): Payer: Self-pay | Admitting: Emergency Medicine

## 2021-12-04 ENCOUNTER — Emergency Department (HOSPITAL_COMMUNITY)
Admission: EM | Admit: 2021-12-04 | Discharge: 2021-12-04 | Disposition: A | Discharge status: Home / Self Care | Payer: Self-pay | Attending: Emergency Medicine | Admitting: Emergency Medicine

## 2021-12-04 DIAGNOSIS — S301XXA Contusion of abdominal wall, initial encounter: Secondary | ICD-10-CM

## 2021-12-04 DIAGNOSIS — R059 Cough, unspecified: Secondary | ICD-10-CM | POA: Insufficient documentation

## 2021-12-04 DIAGNOSIS — R1031 Right lower quadrant pain: Secondary | ICD-10-CM | POA: Insufficient documentation

## 2021-12-04 DIAGNOSIS — M7981 Nontraumatic hematoma of soft tissue: Secondary | ICD-10-CM | POA: Insufficient documentation

## 2021-12-04 LAB — COMPREHENSIVE METABOLIC PANEL
ALT: 27 U/L (ref 0–44)
AST: 25 U/L (ref 15–41)
Albumin: 4.1 g/dL (ref 3.5–5.0)
Alkaline Phosphatase: 45 U/L (ref 38–126)
Anion gap: 9 (ref 5–15)
BUN: 17 mg/dL (ref 8–23)
CO2: 22 mmol/L (ref 22–32)
Calcium: 9.2 mg/dL (ref 8.9–10.3)
Chloride: 104 mmol/L (ref 98–111)
Creatinine, Ser: 0.92 mg/dL (ref 0.61–1.24)
GFR, Estimated: >60 mL/min (ref >60)
Glucose, Bld: 101 mg/dL — ABNORMAL HIGH (ref 70–99)
Potassium: 4.6 mmol/L (ref 3.5–5.1)
Sodium: 135 mmol/L (ref 135–145)
Total Bilirubin: 0.4 mg/dL (ref 0.3–1.2)
Total Protein: 7.8 g/dL (ref 6.5–8.1)

## 2021-12-04 LAB — URINALYSIS, ROUTINE W REFLEX MICROSCOPIC
Bacteria, UA: NONE SEEN
Bilirubin Urine: NEGATIVE
Glucose, UA: NEGATIVE mg/dL
Ketones, ur: NEGATIVE mg/dL
Leukocytes,Ua: NEGATIVE
Nitrite: NEGATIVE
Protein, ur: NEGATIVE mg/dL
Specific Gravity, Urine: >1.046 — ABNORMAL HIGH (ref 1.005–1.030)
pH: 7 (ref 5.0–8.0)

## 2021-12-04 LAB — CBC
HCT: 46.3 % (ref 39.0–52.0)
Hemoglobin: 15.5 g/dL (ref 13.0–17.0)
MCH: 29.6 pg (ref 26.0–34.0)
MCHC: 33.5 g/dL (ref 30.0–36.0)
MCV: 88.5 fL (ref 80.0–100.0)
Platelets: 381 10*3/uL (ref 150–400)
RBC: 5.23 MIL/uL (ref 4.22–5.81)
RDW: 13.6 % (ref 11.5–15.5)
WBC: 8.5 10*3/uL (ref 4.0–10.5)
nRBC: 0 % (ref 0.0–0.2)

## 2021-12-04 LAB — LIPASE, BLOOD: Lipase: 30 U/L (ref 11–51)

## 2021-12-04 MED ORDER — HYDROMORPHONE HCL 1 MG/ML IJ SOLN
0.5000 mg | Freq: Once | INTRAMUSCULAR | Status: AC
  Administered 2021-12-04: 0.5 mg via INTRAVENOUS
  Filled 2021-12-04: qty 0.5

## 2021-12-04 MED ORDER — FENTANYL CITRATE PF 50 MCG/ML IJ SOSY
50.0000 ug | PREFILLED_SYRINGE | Freq: Once | INTRAMUSCULAR | Status: AC
  Administered 2021-12-04: 50 ug via INTRAVENOUS
  Filled 2021-12-04: qty 1

## 2021-12-04 MED ORDER — OXYCODONE-ACETAMINOPHEN 5-325 MG PO TABS: 1.0000 | ORAL_TABLET | Freq: Four times a day (QID) | ORAL | 0 refills | Status: AC | PRN

## 2021-12-04 MED ORDER — IOHEXOL 300 MG/ML  SOLN
100.0000 mL | Freq: Once | INTRAMUSCULAR | Status: AC | PRN
  Administered 2021-12-04: 100 mL via INTRAVENOUS

## 2021-12-04 NOTE — Discharge Instructions (Addendum)
As we discussed, CT imaging of your abdomen did show that you have something known as a rectus sheath hematoma.  Basically, it appears that you likely tore a muscle and associated blood vessels from coughing and this is what is causing your pain.  Unfortunately the management of this is just pain control.  I have given you a prescription for narcotic pain medication for you to take as prescribed as needed for severe pain only.  Do not drive or operate heavy machinery while on this medication.  Additionally, it is very important that you follow-up with your primary care doctor in the next week for further discussion about management of your symptoms.  Return if development of any new or worsening symptoms.

## 2021-12-04 NOTE — ED Provider Notes (Signed)
[facility] EMERGENCY DEPARTMENT Provider Note   CSN: [id number] Arrival date & time: 12/04/21  1049     History  Chief Complaint  Patient presents with   Abdominal Pain    ALQUAN MORRISH is a 66 y.o. male.  Patient with history of right inguinal hernia with repair presents today with complaints of RLQ abdominal pain. He states that over the past several weeks he has had a cough that he has been having difficulty getting rid of. States that he went to his PCP and was diagnosed with bronchitis.  States that 4 days ago he had been coughing and noticed some soreness in his right lower quadrant.  States that this morning around 3 AM he woke up in the night and coughed and felt a pop in his right lower quadrant with immediate severe pain.  He states that he has had worsening pain since then which she describes as the worst pain he is ever felt.  He denies any associated nausea, vomiting, or diarrhea.  He states that he had a bowel movement this morning that was normal but was painful to push out.  He states that several years ago he had a hernia in this area that was repaired with mesh and he is concerned that he has torn through the mesh which is causing his symptoms.  He denies any fevers or chills, hematuria, or dysuria.  The history is provided by the patient. No language interpreter was used.  Abdominal Pain      Home Medications Prior to Admission medications   Medication Sig Start Date End Date Taking? Authorizing Provider  omeprazole (PRILOSEC) 40 MG capsule Take 1 capsule (40 mg total) by mouth daily. Patient not taking: Reported on 12/04/2021 08/08/19   Jacquelin Hawking, PA-C  simvastatin (ZOCOR) 20 MG tablet Take 1 tablet (20 mg total) by mouth at bedtime. Patient not taking: Reported on 12/04/2021 08/08/19   Jacquelin Hawking, PA-C      Allergies    Other    Review of Systems   Review of Systems  Gastrointestinal:  Positive for abdominal pain.  All other systems reviewed  and are negative.   Physical Exam Updated Vital Signs BP (!) 165/99   Pulse 73   Temp 98 F (36.7 C) (Oral)   Resp (!) 21   Ht 5\' 8"  (1.727 m)   Wt 79.4 kg   SpO2 92%   BMI 26.61 kg/m  Physical Exam Vitals and nursing note reviewed.  Constitutional:      General: He is not in acute distress.    Appearance: Normal appearance. He is normal weight. He is not ill-appearing, toxic-appearing or diaphoretic.  HENT:     Head: Normocephalic and atraumatic.  Cardiovascular:     Rate and Rhythm: Normal rate.  Pulmonary:     Effort: Pulmonary effort is normal. No respiratory distress.  Abdominal:     Tenderness: There is abdominal tenderness in the right lower quadrant.     Comments: Significant palpable right lower quadrant abdominal tenderness noted with palpable swelling noted which is hard to differentiate given the level of patient's discomfort.  No overlying skin changes.  Musculoskeletal:        General: Normal range of motion.     Cervical back: Normal range of motion.  Skin:    General: Skin is warm and dry.  Neurological:     General: No focal deficit present.     Mental Status: He is alert.  Psychiatric:  Mood and Affect: Mood normal.        Behavior: Behavior normal.     ED Results / Procedures / Treatments   Labs (all labs ordered are listed, but only abnormal results are displayed) Labs Reviewed  COMPREHENSIVE METABOLIC PANEL - Abnormal; Notable for the following components:      Result Value   Glucose, Bld 101 (*)    All other components within normal limits  URINALYSIS, ROUTINE W REFLEX MICROSCOPIC - Abnormal; Notable for the following components:   Specific Gravity, Urine >1.046 (*)    Hgb urine dipstick SMALL (*)    All other components within normal limits  LIPASE, BLOOD  CBC    EKG EKG Interpretation  Date/Time:  Thursday December 04 2021 11:38:13 EDT Ventricular Rate:  76 PR Interval:  213 QRS Duration: 79 QT Interval:  365 QTC  Calculation: 411 R Axis:   37 Text Interpretation: Sinus rhythm Borderline prolonged PR interval Abnormal R-wave progression, early transition Confirmed by Alvester Chou [phone]) on 12/04/2021 2:14:40 PM  Radiology CT ABDOMEN PELVIS W CONTRAST  Result Date: 12/04/2021 CLINICAL DATA:  RLQ abdominal pain (Age >= 14y) EXAM: CT ABDOMEN AND PELVIS WITH CONTRAST TECHNIQUE: Multidetector CT imaging of the abdomen and pelvis was performed using the standard protocol following bolus administration of intravenous contrast. RADIATION DOSE REDUCTION: This exam was performed according to the departmental dose-optimization program which includes automated exposure control, adjustment of the mA and/or kV according to patient size and/or use of iterative reconstruction technique. CONTRAST:  OMNIPAQUE IOHEXOL 300 MG/ML  SOLN COMPARISON:  May 2019 FINDINGS: Lower chest: No acute abnormality. Hepatobiliary: No focal liver abnormality is seen. No gallstones, gallbladder wall thickening, or biliary dilatation. Pancreas: Unremarkable. No pancreatic ductal dilatation or surrounding inflammatory changes. Spleen: Normal in size without focal abnormality. Adrenals/Urinary Tract: Adrenals are unremarkable. Too small to characterize low-density lesion of the right kidney. Bladder is unremarkable. Stomach/Bowel: Stomach is within normal limits. Bowel is normal in caliber. Distal colonic diverticulosis. Normal appendix. Vascular/Lymphatic: Atherosclerosis.  No enlarged nodes. Reproductive: Enlarged prostate. Other: Right rectus abdominus is enlarged with increased density. There is underlying ill-defined extraperitoneal soft tissue density. Small fat containing umbilical hernia. Probable prior right inguinal hernia repair. No free fluid. Musculoskeletal: As above. Degenerative changes of the included spine. IMPRESSION: Right rectus sheath hematoma with underlying extraperitoneal hematoma. Colonic diverticulosis. Electronically  Signed   By: Guadlupe Spanish M.D.   On: 12/04/2021 14:00    Procedures Procedures    Medications Ordered in ED Medications  fentaNYL (SUBLIMAZE) injection 50 mcg (50 mcg Intravenous Given 12/04/21 1318)  iohexol (OMNIPAQUE) 300 MG/ML solution 100 mL (100 mLs Intravenous Contrast Given 12/04/21 1333)    ED Course/ Medical Decision Making/ A&P                           Medical Decision Making Amount and/or Complexity of Data Reviewed Labs: ordered. Radiology: ordered.  Risk Prescription drug management.   This patient presents to the ED for concern of RLQ abdominal pain, this involves an extensive number of treatment options, and is a complaint that carries with it a high risk of complications and morbidity.  The differential diagnosis includes appendicitis, hernia. This is not an exhaustive differential    Co morbidities that complicate the patient evaluation  Hx right inguinal hernia with repair several years ago   Lab Tests:  I Ordered, and personally interpreted labs.  The pertinent results include:  mild  hematuria, no other acute laboratory findings   Imaging Studies ordered:  I ordered imaging studies including CT abdomen pelvis with contrast  I independently visualized and interpreted imaging which showed  Right rectus sheath hematoma with underlying extraperitoneal hematoma. I agree with the radiologist interpretation   Problem List / ED Course / Critical interventions / Medication management  Rectus sheath hematoma I ordered medication including fentanyl and dilaudid  for pain  Reevaluation of the patient after these medicines showed that the patient improved I have reviewed the patients home medicines and have made adjustments as needed   Test / Admission - Considered:  Patient presents today with complaints of RLQ pain since he coughed early this morning. Patient is nontoxic, nonseptic appearing, in no apparent distress.  Patient's pain and other symptoms  adequately managed in emergency department.  Labs, imaging and vitals reviewed.  Patient does not meet the SIRS or Sepsis criteria.  On repeat exam patient does not have a surgical abdomin and there are no peritoneal signs.  No indication of appendicitis, bowel obstruction, bowel perforation, cholecystitis, or diverticulitis. Patient does have a right rectus sheath hematoma seen on CT imaging which is likely etiology of patients symptoms. I have discussed these findings with the patient who is understanding. He is not anticoagulated. He is also hemodynamically stable. Given this, after discussion with my attending Dr. Renaye Rakers, I think it is reasonable to send the patient home with oral pain control and close PCP follow-up.  Personally reviewed PDMP and deemed patient an adequate candidate for this.  Patient discharged home with symptomatic treatment and given strict instructions for follow-up with their primary care physician.  I have also discussed reasons to return immediately to the ER.  Patient expresses understanding and agrees with plan.  Discharged in stable condition.  Findings and plan of care discussed with supervising physician Dr. Renaye Rakers who is in agreement.    Final Clinical Impression(s) / ED Diagnoses Final diagnoses:  Hematoma of rectus sheath, initial encounter    Rx / DC Orders ED Discharge Orders          Ordered    oxyCODONE-acetaminophen (PERCOCET/ROXICET) 5-325 MG tablet  Every 6 hours PRN        12/04/21 1607          An After Visit Summary was printed and given to the patient.     Vear Clock 12/04/21 1609    Terald Sleeper, MD 12/04/21 [phone]

## 2021-12-04 NOTE — ED Triage Notes (Signed)
Pt presents with RLQ abdominal pain x 5 days, history includes hernia.
